# Patient Record
Sex: Male | Born: 2000 | Race: White | Hispanic: No | Marital: Single | Smoking: Never smoker
Health system: Southern US, Community
[De-identification: ages and names within clinical notes are randomized; demographics above are authoritative.]

## PROBLEM LIST (undated history)

## (undated) HISTORY — PX: BRAIN SURGERY: SHX531

## (undated) HISTORY — PX: OTHER SURGICAL HISTORY: SHX169

---
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
## 2019-01-01 NOTE — Addendum Note (Signed)
Addended by: Roxana Hires D on: 01/01/2019 10:44 AM   Modules accepted: Orders

## 2019-01-02 ENCOUNTER — Other Ambulatory Visit: Payer: Self-pay

## 2019-01-02 ENCOUNTER — Ambulatory Visit: Payer: BC Managed Care – PPO

## 2019-01-02 ENCOUNTER — Ambulatory Visit: Payer: BC Managed Care – PPO | Admitting: Occupational Therapy

## 2019-01-02 ENCOUNTER — Encounter: Payer: Self-pay | Admitting: Occupational Therapy

## 2019-01-02 DIAGNOSIS — R278 Other lack of coordination: Secondary | ICD-10-CM

## 2019-01-02 DIAGNOSIS — M6281 Muscle weakness (generalized): Secondary | ICD-10-CM
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
## 2019-01-07 ENCOUNTER — Encounter: Payer: Self-pay | Admitting: Occupational Therapy

## 2019-01-07 ENCOUNTER — Other Ambulatory Visit: Payer: Self-pay

## 2019-01-07 ENCOUNTER — Ambulatory Visit: Payer: BC Managed Care – PPO

## 2019-01-07 ENCOUNTER — Ambulatory Visit: Payer: BC Managed Care – PPO | Admitting: Occupational Therapy

## 2019-01-07 DIAGNOSIS — M6281 Muscle weakness (generalized): Secondary | ICD-10-CM | POA: Diagnosis not present

## 2019-01-07 DIAGNOSIS — R278 Other lack of coordination: Secondary | ICD-10-CM
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
## 2019-01-09 ENCOUNTER — Ambulatory Visit: Payer: BC Managed Care – PPO | Admitting: Occupational Therapy

## 2019-01-09 ENCOUNTER — Encounter: Payer: Self-pay | Admitting: Occupational Therapy

## 2019-01-09 ENCOUNTER — Other Ambulatory Visit: Payer: Self-pay

## 2019-01-09 ENCOUNTER — Ambulatory Visit: Payer: BC Managed Care – PPO

## 2019-01-09 DIAGNOSIS — M6281 Muscle weakness (generalized): Secondary | ICD-10-CM

## 2019-01-09 DIAGNOSIS — R278 Other lack of coordination: Secondary | ICD-10-CM
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
## 2019-01-14 ENCOUNTER — Ambulatory Visit: Payer: BC Managed Care – PPO | Admitting: Occupational Therapy

## 2019-01-14 ENCOUNTER — Ambulatory Visit: Payer: BC Managed Care – PPO

## 2019-01-14 ENCOUNTER — Other Ambulatory Visit: Payer: Self-pay

## 2019-01-14 DIAGNOSIS — M6281 Muscle weakness (generalized): Secondary | ICD-10-CM | POA: Diagnosis not present

## 2019-01-14 DIAGNOSIS — R278 Other lack of coordination: Secondary | ICD-10-CM
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
## 2019-01-16 ENCOUNTER — Encounter: Payer: Self-pay | Admitting: Occupational Therapy

## 2019-01-16 ENCOUNTER — Other Ambulatory Visit: Payer: Self-pay

## 2019-01-16 ENCOUNTER — Ambulatory Visit: Payer: BC Managed Care – PPO | Admitting: Occupational Therapy

## 2019-01-16 ENCOUNTER — Ambulatory Visit: Payer: BC Managed Care – PPO

## 2019-01-16 DIAGNOSIS — M6281 Muscle weakness (generalized): Secondary | ICD-10-CM

## 2019-01-16 DIAGNOSIS — R278 Other lack of coordination: Secondary | ICD-10-CM

## 2019-01-16 DIAGNOSIS — R2681 Unsteadiness on feet: Secondary | ICD-10-CM
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
## 2019-01-21 ENCOUNTER — Ambulatory Visit: Payer: BC Managed Care – PPO

## 2019-01-21 ENCOUNTER — Other Ambulatory Visit: Payer: Self-pay

## 2019-01-21 ENCOUNTER — Ambulatory Visit: Payer: BC Managed Care – PPO | Attending: Physical Medicine and Rehabilitation | Admitting: Occupational Therapy

## 2019-01-21 DIAGNOSIS — R278 Other lack of coordination: Secondary | ICD-10-CM

## 2019-01-21 DIAGNOSIS — R2681 Unsteadiness on feet: Secondary | ICD-10-CM | POA: Insufficient documentation

## 2019-01-21 DIAGNOSIS — M6281 Muscle weakness (generalized): Secondary | ICD-10-CM | POA: Insufficient documentation
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
## 2019-01-23 ENCOUNTER — Encounter: Payer: Self-pay | Admitting: Occupational Therapy

## 2019-01-23 ENCOUNTER — Other Ambulatory Visit: Payer: Self-pay

## 2019-01-23 ENCOUNTER — Ambulatory Visit: Payer: BC Managed Care – PPO | Admitting: Occupational Therapy

## 2019-01-23 ENCOUNTER — Ambulatory Visit: Payer: BC Managed Care – PPO

## 2019-01-23 DIAGNOSIS — R2681 Unsteadiness on feet: Secondary | ICD-10-CM

## 2019-01-23 DIAGNOSIS — M6281 Muscle weakness (generalized): Secondary | ICD-10-CM | POA: Diagnosis not present

## 2019-01-23 DIAGNOSIS — R278 Other lack of coordination: Secondary | ICD-10-CM
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
## 2019-01-23 NOTE — Therapy (Signed)
[place] MAIN [facility] SERVICES [address] [place], [place], [address] Phone: [phone]   Fax:  [phone]  Physical Therapy Treatment  Patient Details  Name: Edgar [name] MRN: [id number] Date of Birth: 2000-10-17 No data recorded  Encounter Date: 01/23/2019  PT End of Session - 01/23/19 1530    Visit Number  285    Number of Visits  410    Date for PT Re-Evaluation  03/13/19    Authorization Type  goals last updated on 12/26/18    Authorization Time Period  Medicaid authorization 2x/wk (total of 12 visits): 01/15/19-02/25/19    Authorization - Visit Number  3    Authorization - Number of Visits  12    PT Start Time  1520    PT Stop Time  1600    PT Time Calculation (min)  40 min    Equipment Utilized During Treatment  Gait belt    Activity Tolerance  Patient tolerated treatment well;No increased pain    Behavior During Therapy  [facility] for tasks assessed/performed       History reviewed. No pertinent past medical history.  History reviewed. No pertinent surgical history.  There were no vitals filed for this visit.  Subjective Assessment - 01/23/19 1529    Subjective  Pt reports doing well today.  He reports no pain or soreness today or after last session.  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 No questions or concerns at this time.    Pertinent History  personal factors affecting rehab: younger in age, time since initial injury, high fall risk, good caregiver support, going back to school so limited time available;     How long can you sit comfortably?  NA    How long can you walk comfortably?  2-3 laps around a small track;     Diagnostic tests  None recent;     Patient Stated Goals  be able to pick up his 60 pound boxer/pit bull mix dog, Buster; to be able to get up from the floor without pushing off of an object with his hands.     Currently in Pain?  No/denies           TREATMENT    Ther-ex Quantum single leg seated leg press 150# x 20 BLE, 165# x 9 on RLE and x 14 on LLE side; Forward broad jumps in 1, 2, and 3jump sequences x multiple bouts;  Squat side shuffle in // bars x 6 lengths; Squats with bottom taps on chair without sitting x 10, CGA only with one LOB for which pt is able to self-correct   Neuromuscular Re-education  Basketball lateral passes with therapist during gait 75' x 3; Basketball forward passes with therapist during gait x 75'; Soccer ball passes laterally x 75' each direction; Running in hallway 40' x 2, CGA to minA+1 for safety/stability; Soccer goalie passes with quick lateral shuffles, reactionary stepping, and cross-over steps x 5 minutes;   Pt educated throughout session about proper posture and technique with exercises. Improved exercise technique, movement at target joints, use of target muscles after min to mod verbal, visual, tactile cues.   Pt is less fatigued today compared to last session but is still tired during session. He spent another 5 hours driving today which was mentally fatiguing for him. He is demonstrating improving toe clearance with running in the hallway today. He is also improving with his stability during  squats and broad jumps.He will continue to benefit from PT services to address deficits in strength, balance, and mobility in order to return to full function at home and school.                          PT Long Term Goals - 12/27/18 0001      PT LONG TERM GOAL #1   Title  Patient will improve 6 min walk test to >2000 feet to allow him to fully navigate the [facility] within appropriate time frames to transition between classes as well as play basketball with his friends (2948' is distance predicted by 6MWT reference equation based on age, gender, height, and weight)       Baseline  10/05/17: 950 feet on level surface;  01/31/18: 1100 feet on level surface, 03/28/18: 1250 feet; 05/21/18: 1170'; 06/11/18: 1300'; 08/22/2018: 1200 feet R AFO, no AD; 10/01/18: 1225' no AFO, no AD; 10/31/18: 1335' AFO, no AD, cloth facemask, CGA; 12/05/18: 1370' AFO, no AD, cloth facemask, CGA; 12/26/18: 1370' AFO, no AD, cloth facemask, CGA    Time  12    Period  Weeks    Status  Partially Met    Target Date  03/13/19      PT LONG TERM GOAL #2   Title  Patient will improve FGA score by at least 6 points to indicate improved postural control for better balance specifically with head turns during ambulation and backwards walking. These skills are critical for patient to be able to safely scan his environment during ambulation as well as safely participate in recreational activities such as basketball which would require him to regularly perform dynamic head turns and backwards stepping.     Baseline  01/31/18: 18/24, 03/28/18: 19/24; 05/22/18: 20/30; 08/20/2018: 23/30; 10/02/18: 23/30; 10/31/18: 23/30; 12/05/18: 25/30; 12/26/18: 26/30     Time  12    Period  Weeks    Status  Achieved    Target Date  03/13/19      PT LONG TERM GOAL #3   Title  Pt will improve ABC by at least 13%, which will also exceed falls cut-off of 67%, in order to demonstrate clinically significant improvement in balance confidence and decrease his risk for future falls.    Baseline  10/01/18: 64.375%; 10/31/18: 84.69%    Time  12    Period  Weeks    Status  Achieved      PT LONG TERM GOAL #4   Title  Patient will demonstrate floor to/from stand transfer without UE support and no external assistance in the possibly that he needs to perform tasks at floor level or if he needs to stand from the ground after a fall.    Baseline  08/20/2018: Requires L UE support on chair when rising from kneeling; 08/29/2018: required min A hand held assist when rising from tall kneeling; 10/01/18: Unable to transfer to/from floor without minA+1 assist from therapist as well as LUE support on a surface  such as a chair or railing. 11/06/18: Able to perform with rear knee (right) on Airex pad and BUE pressing through L knee, unable to perform from floor. 12/10/18: Able to perform with rear LE (left) on towel inconsistently requiring BUE through RLE to come to stand; 12/26/18: able to perform with L knee on towel inconsistently requiring BUE through RLE to come to stand, unable to perform with R knee on towel without BUE support on //  bars and mod/maxA from PT.     Time  12    Period  Weeks    Status  Partially Met    Target Date  03/13/19      PT LONG TERM GOAL #5   Title  Pt will increase LEFS by at least 9 points in order to demonstrate significant improvement in lower extremity function especially related to running and cutting which will allow him to participate in basketball with his friends.    Baseline  10/01/18: 51/80; 10/31/18: 59/80; 12/10/18: 62/80    Time  12    Period  Weeks    Status  Achieved      Additional Long Term Goals   Additional Long Term Goals  Yes      PT LONG TERM GOAL #6   Title  Pt will improve Mini BESTest by at least 4 points in order to demonstrate clinically significant improvement in his balance especially in the domains of vertical head turns, backwards walking, and eyes closed walking to allow him to safely participate in sports, scan his environment when walking across campus, and decrease his risk for falls in low light environments.     Baseline  10/01/18: 21/28; 10/31/18: 24/28; 12/05/18: 24/28; 12/26/18: 25/28    Time  12    Period  Weeks    Status  Achieved    Target Date  03/13/19            Plan - 01/23/19 1531    Clinical Impression Statement  Pt is less fatigued today compared to last session but is still tired during session. He spent another 5 hours driving today which was mentally fatiguing for him. He is demonstrating improving toe clearance with running in the hallway today. He is also improving with his stability during squats and broad jumps. He  will continue to benefit from PT services to address deficits in strength, balance, and mobility in order to return to full function at home and school.    Examination-Activity Limitations  Carry;Lift;Squat;Stairs;Caring for Others;Reach Overhead;Bed Mobility;Bend;Transfers;Other   age-appropriate functional activities such as bed mobility, transfers, bending, lifting, carrying, lunging, running, squatting, walking, stairs, athletics, navigation across uneven ground,    Examination-Participation Restrictions  School;Community Activity;Driving;Other   athletics, community and social participation, and pet care   Stability/Clinical Decision Making  Evolving/Moderate complexity    Rehab Potential  Good    Clinical Impairments Affecting Rehab Potential  positive: good caregiver support, young in age, no co-morbidities; Negative: Chronicity, high fall risk; Patient's clinical presentation is stable as he has had no recent falls and has been responding well to conservative treatment;     PT Frequency  2x / week    PT Duration  12 weeks    PT Treatment/Interventions  Cryotherapy;Electrical Stimulation;Moist Heat;Gait training;Neuromuscular re-education;Balance training;Therapeutic exercise;Therapeutic activities;Functional mobility training;Stair training;Patient/family education;Orthotic Fit/Training;Energy conservation;Dry needling;Passive range of motion;Aquatic Therapy;ADLs/Self Care Home Management;DME Instruction;Manual techniques;Vestibular   cryotherapy/moist heat for pt comfort for soreness, relaxation, & body temperature regulation PRN; estim for improved muscle activation, relaxation of tight muscles, or soreness relief PRN; dry needling for improved soreness, decreased muscle tension PRN   PT Next Visit Plan  Come to stand from 1/2 kneeling, Ambulation with ball tossing, pivot turns, stepping over obstacles, jumping, bounding, lunging, floor to/from stand transfers, continue practicing with new AFO     PT Home Exercise Plan  New HEP provided on 12/19/18: [email or web address]    Consulted and Agree with Plan of Care  Patient  Patient will benefit from skilled therapeutic intervention in order to improve the following deficits and impairments:  Abnormal gait, Decreased cognition, Decreased mobility, Decreased coordination, Decreased activity tolerance, Decreased endurance, Decreased strength, Difficulty walking, Decreased safety awareness, Decreased balance, Impaired perceived functional ability, Impaired UE functional use, Decreased range of motion  Visit Diagnosis: Muscle weakness (generalized)  Unsteadiness on feet  Other lack of coordination     Problem List There are no active problems to display for this patient.  Phillips Grout PT, DPT, GCS  , 01/23/2019, 4:03 PM  [place] MAIN [facility] SERVICES [address] [place], [place], [address] Phone: [phone]   Fax:  [phone]  Name: YUVAAN OLANDER MRN: [id number] Date of Birth: November 30, 2000

## 2019-01-28 ENCOUNTER — Ambulatory Visit: Payer: BC Managed Care – PPO

## 2019-01-28 ENCOUNTER — Encounter: Payer: Self-pay | Admitting: Occupational Therapy

## 2019-01-28 ENCOUNTER — Other Ambulatory Visit: Payer: Self-pay

## 2019-01-28 ENCOUNTER — Ambulatory Visit: Payer: BC Managed Care – PPO | Admitting: Occupational Therapy

## 2019-01-28 DIAGNOSIS — M6281 Muscle weakness (generalized): Secondary | ICD-10-CM

## 2019-01-28 DIAGNOSIS — R2681 Unsteadiness on feet: Secondary | ICD-10-CM

## 2019-01-28 DIAGNOSIS — R278 Other lack of coordination: Secondary | ICD-10-CM

## 2019-01-28 NOTE — Therapy (Signed)
[place] MAIN [facility] SERVICES [address] [place], [place], [address] Phone: [phone]   Fax:  [phone]  Physical Therapy Treatment  Patient Details  Name: Edgar [name] MRN: [id number] Date of Birth: 2000/11/30 No data recorded  Encounter Date: 01/28/2019  PT End of Session - 01/28/19 1647    Visit Number  286    Number of Visits  410    Date for PT Re-Evaluation  03/13/19    Authorization Type  goals last updated on 12/26/18    Authorization Time Period  Medicaid authorization 2x/wk (total of 12 visits): 01/15/19-02/25/19    Authorization - Visit Number  4    Authorization - Number of Visits  12    PT Start Time  5188    PT Stop Time  1730    PT Time Calculation (min)  36 min    Equipment Utilized During Treatment  Gait belt    Activity Tolerance  Patient tolerated treatment well;No increased pain    Behavior During Therapy  [facility] for tasks assessed/performed       History reviewed. No pertinent past medical history.  History reviewed. No pertinent surgical history.  There were no vitals filed for this visit.  Subjective Assessment - 01/28/19 1655    Subjective  Pt reports doing well today.  He reports no pain or soreness today.  He reports that he drove here today. No questions or concerns at this time.    Pertinent History  personal factors affecting rehab: younger in age, time since initial injury, high fall risk, good caregiver support, going back to school so limited time available;     How long can you sit comfortably?  NA    How long can you walk comfortably?  2-3 laps around a small track;     Diagnostic tests  None recent;     Patient Stated Goals  be able to pick up his 60 pound boxer/pit bull mix dog, Buster; to be able to get up from the floor without pushing off of an object with his hands.     Currently in Pain?  No/denies         TREATMENT    Ther-ex Quantumsingle legseated leg press 150# x 20  BLE, 165# x 8 on RLE and x14 on LLE side;   Neuromuscular Re-education Basketball forward passes with therapist during forward and retro gait x 75'; Soccer ball passes with forward and retro gait x 75' each direction; Agility ladder with 2 bouts each of 2 feet in each box forward, 2 feet in each box laterally, and "2 in 1" out forward. Forward broad jumps in 1, 2, and 3jump sequences x multiple bouts;   Pt educated throughout session about proper posture and technique with exercises. Improved exercise technique, movement at target joints, use of target muscles after min to mod verbal, visual, tactile cues.   Ptdemonstrated good motivation throughout today's session.  His session was shortened today due to him needing to go to the bathroom between his OT and PT sessions, resulting in him being late to PT.  He is demonstrating good improvements with foot clearance and foot speed during agility ladder exercises today. improving toe clearance with running in the hallway today. He is also improving with his stability during broad jumps, requiring steadying intermittently for LOB anteriorly.He will continue to benefit from PT services to address deficits in strength, balance, and mobility in order to return to full function at home and  school.           PT Long Term Goals - 12/27/18 0001      PT LONG TERM GOAL #1   Title  Patient will improve 6 min walk test to >2000 feet to allow him to fully navigate the [facility] within appropriate time frames to transition between classes as well as play basketball with his friends (2948' is distance predicted by 6MWT reference equation based on age, gender, height, and weight)       Baseline  10/05/17: 950 feet on level surface; 01/31/18: 1100 feet on level surface, 03/28/18: 1250 feet; 05/21/18: 1170'; 06/11/18: 1300'; 08/22/2018: 1200 feet R AFO, no AD; 10/01/18: 1225' no AFO, no AD; 10/31/18: 1335' AFO, no AD, cloth facemask,  CGA; 12/05/18: 1370' AFO, no AD, cloth facemask, CGA; 12/26/18: 1370' AFO, no AD, cloth facemask, CGA    Time  12    Period  Weeks    Status  Partially Met    Target Date  03/13/19      PT LONG TERM GOAL #2   Title  Patient will improve FGA score by at least 6 points to indicate improved postural control for better balance specifically with head turns during ambulation and backwards walking. These skills are critical for patient to be able to safely scan his environment during ambulation as well as safely participate in recreational activities such as basketball which would require him to regularly perform dynamic head turns and backwards stepping.     Baseline  01/31/18: 18/24, 03/28/18: 19/24; 05/22/18: 20/30; 08/20/2018: 23/30; 10/02/18: 23/30; 10/31/18: 23/30; 12/05/18: 25/30; 12/26/18: 26/30     Time  12    Period  Weeks    Status  Achieved    Target Date  03/13/19      PT LONG TERM GOAL #3   Title  Pt will improve ABC by at least 13%, which will also exceed falls cut-off of 67%, in order to demonstrate clinically significant improvement in balance confidence and decrease his risk for future falls.    Baseline  10/01/18: 64.375%; 10/31/18: 84.69%    Time  12    Period  Weeks    Status  Achieved      PT LONG TERM GOAL #4   Title  Patient will demonstrate floor to/from stand transfer without UE support and no external assistance in the possibly that he needs to perform tasks at floor level or if he needs to stand from the ground after a fall.    Baseline  08/20/2018: Requires L UE support on chair when rising from kneeling; 08/29/2018: required min A hand held assist when rising from tall kneeling; 10/01/18: Unable to transfer to/from floor without minA+1 assist from therapist as well as LUE support on a surface such as a chair or railing. 11/06/18: Able to perform with rear knee (right) on Airex pad and BUE pressing through L knee, unable to perform from floor. 12/10/18: Able to perform with rear LE (left) on  towel inconsistently requiring BUE through RLE to come to stand; 12/26/18: able to perform with L knee on towel inconsistently requiring BUE through RLE to come to stand, unable to perform with R knee on towel without BUE support on // bars and mod/maxA from PT.     Time  12    Period  Weeks    Status  Partially Met    Target Date  03/13/19      PT LONG TERM GOAL #5  Title  Pt will increase LEFS by at least 9 points in order to demonstrate significant improvement in lower extremity function especially related to running and cutting which will allow him to participate in basketball with his friends.    Baseline  10/01/18: 51/80; 10/31/18: 59/80; 12/10/18: 62/80    Time  12    Period  Weeks    Status  Achieved      Additional Long Term Goals   Additional Long Term Goals  Yes      PT LONG TERM GOAL #6   Title  Pt will improve Mini BESTest by at least 4 points in order to demonstrate clinically significant improvement in his balance especially in the domains of vertical head turns, backwards walking, and eyes closed walking to allow him to safely participate in sports, scan his environment when walking across campus, and decrease his risk for falls in low light environments.     Baseline  10/01/18: 21/28; 10/31/18: 24/28; 12/05/18: 24/28; 12/26/18: 25/28    Time  12    Period  Weeks    Status  Achieved    Target Date  03/13/19            Plan - 01/28/19 1851    Clinical Impression Statement  Pt demonstrated good motivation throughout today's session.  His session was shortened today due to him needing to go to the bathroom between his OT and PT sessions, resulting in him being late to PT.  He is demonstrating good improvements with foot clearance and foot speed during agility ladder exercises today. improving toe clearance with running in the hallway today. He is also improving with his stability during broad jumps, requiring steadying intermittently for LOB anteriorly. He will continue to benefit  from PT services to address deficits in strength, balance, and mobility in order to return to full function at home and school.    Examination-Activity Limitations  Carry;Lift;Squat;Stairs;Caring for Others;Reach Overhead;Bed Mobility;Bend;Transfers;Other   age-appropriate functional activities such as bed mobility, transfers, bending, lifting, carrying, lunging, running, squatting, walking, stairs, athletics, navigation across uneven ground,    Examination-Participation Restrictions  School;Community Activity;Driving;Other   athletics, community and social participation, and pet care   Stability/Clinical Decision Making  Evolving/Moderate complexity    Rehab Potential  Good    Clinical Impairments Affecting Rehab Potential  positive: good caregiver support, young in age, no co-morbidities; Negative: Chronicity, high fall risk; Patient's clinical presentation is stable as he has had no recent falls and has been responding well to conservative treatment;     PT Frequency  2x / week    PT Duration  12 weeks    PT Treatment/Interventions  Cryotherapy;Electrical Stimulation;Moist Heat;Gait training;Neuromuscular re-education;Balance training;Therapeutic exercise;Therapeutic activities;Functional mobility training;Stair training;Patient/family education;Orthotic Fit/Training;Energy conservation;Dry needling;Passive range of motion;Aquatic Therapy;ADLs/Self Care Home Management;DME Instruction;Manual techniques;Vestibular   cryotherapy/moist heat for pt comfort for soreness, relaxation, & body temperature regulation PRN; estim for improved muscle activation, relaxation of tight muscles, or soreness relief PRN; dry needling for improved soreness, decreased muscle tension PRN   PT Next Visit Plan  Come to stand from 1/2 kneeling, Ambulation with ball tossing, pivot turns, stepping over obstacles, jumping, bounding, lunging, floor to/from stand transfers, continue practicing with new AFO    PT Home Exercise Plan   New HEP provided on 12/19/18: [email or web address]    Consulted and Agree with Plan of Care  Patient       Patient will benefit from skilled therapeutic intervention in order to improve the following deficits and  impairments:  Abnormal gait, Decreased cognition, Decreased mobility, Decreased coordination, Decreased activity tolerance, Decreased endurance, Decreased strength, Difficulty walking, Decreased safety awareness, Decreased balance, Impaired perceived functional ability, Impaired UE functional use, Decreased range of motion  Visit Diagnosis: Muscle weakness (generalized)  Unsteadiness on feet  Other lack of coordination     Problem List There are no active problems to display for this patient.   This entire session was performed under direct supervision and direction of a licensed therapist/therapist assistant . I have personally read, edited and approve of the note as written.   Lutricia Horsfall, SPT Phillips Grout PT, DPT, GCS  Huprich,Jason 01/29/2019, 3:35 PM  [place] MAIN [facility] SERVICES [address] [place], [place], [address] Phone: [phone]   Fax:  [phone]  Name: PRUDENCIO VELAZCO MRN: [id number] Date of Birth: 04-Jul-2000
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
## 2019-01-30 ENCOUNTER — Other Ambulatory Visit: Payer: Self-pay

## 2019-01-30 ENCOUNTER — Ambulatory Visit: Payer: BC Managed Care – PPO

## 2019-01-30 ENCOUNTER — Ambulatory Visit: Payer: BC Managed Care – PPO | Admitting: Occupational Therapy

## 2019-01-30 ENCOUNTER — Encounter: Payer: Self-pay | Admitting: Occupational Therapy

## 2019-01-30 DIAGNOSIS — R2681 Unsteadiness on feet: Secondary | ICD-10-CM

## 2019-01-30 DIAGNOSIS — M6281 Muscle weakness (generalized): Secondary | ICD-10-CM

## 2019-01-30 DIAGNOSIS — R278 Other lack of coordination: Secondary | ICD-10-CM
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
## 2019-01-30 NOTE — Therapy (Signed)
[place] MAIN [facility] SERVICES [address] [place], [place], [address] Phone: [phone]   Fax:  [phone]  Physical Therapy Treatment  Patient Details  Name: Edgar [name] MRN: [id number] Date of Birth: 2000-10-13 No data recorded  Encounter Date: 01/30/2019  PT End of Session - 01/30/19 1519    Visit Number  [id number]    Number of Visits  410    Date for PT Re-Evaluation  03/13/19    Authorization Type  goals last updated on 12/26/18    Authorization Time Period  Medicaid authorization 2x/wk (total of 12 visits): 01/15/19-02/25/19    Authorization - Visit Number  5    Authorization - Number of Visits  12    PT Start Time  4825    PT Stop Time  1600    PT Time Calculation (min)  45 min    Equipment Utilized During Treatment  Gait belt    Activity Tolerance  Patient tolerated treatment well;No increased pain    Behavior During Therapy  [facility] for tasks assessed/performed       History reviewed. No pertinent past medical history.  History reviewed. No pertinent surgical history.  There were no vitals filed for this visit.  Subjective Assessment - 01/30/19 1517    Subjective  Pt reports doing well today.  He reports that he had clonus in his LEs this morning, but that it went away after stretching out his legs.  He reports no pain or soreness today, but did have some soreness after last session. No questions or concerns at this time.    Pertinent History  personal factors affecting rehab: younger in age, time since initial injury, high fall risk, good caregiver support, going back to school so limited time available;     How long can you sit comfortably?  NA    How long can you walk comfortably?  2-3 laps around a small track;     Diagnostic tests  None recent;     Patient Stated Goals  be able to pick up his 60 pound boxer/pit bull mix dog, Buster; to be able to get up from the floor without pushing off of an object with his hands.      Currently in Pain?  No/denies        TREATMENT    Ther-ex Octane X-Ride 5 min L4 for 5 min for warmup during history Lunges in // barswith airex pad underrearknee,CGA provided throughout with intermittent modAwhen LLE is forward and modA with RLE is forward x 5 on each side with intermittent UE support in // bars; limited due to fatigue today (performed last today)   Neuromuscular Re-education Jogging on grass with CGA and occasional minA from PT throughout  164f x4 Basketball forward passes outside on sloping sidewalk 731f2 Soccer ball passes with forward gait in the grass 7519f Forward broad jumps in the grass ~5 jumps;  Ascending and Descending 5 steps in a reciprocal pattern without use of the handrails; CGA throughout.   Pt educated throughout session about proper posture and technique with exercises. Improved exercise technique, movement at target joints, use of target muscles after min to mod verbal, visual, tactile cues.   Ptdemonstrated good motivation throughout today's session.  Today, pt was challenged by performing tasks outside on a sloping sidewalk, walking through pine straw on hill and performing exercises on the grass.  He requires CGA with occasional minA during jogging on grass, with unsteadiness noted throughout.  He also  demonstrates the ability to perform 5 steps without the use of a handrail in a reciprocal pattern with CGA. He will continue to benefit from PT services to address deficits in strength, balance, and mobility in order to return to full function at home and school.       PT Long Term Goals - 12/27/18 0001      PT LONG TERM GOAL #1   Title  Patient will improve 6 min walk test to >2000 feet to allow him to fully navigate the [facility] within appropriate time frames to transition between classes as well as play basketball with his friends (2948' is distance predicted by 6MWT reference equation  based on age, gender, height, and weight)       Baseline  10/05/17: 950 feet on level surface; 01/31/18: 1100 feet on level surface, 03/28/18: 1250 feet; 05/21/18: 1170'; 06/11/18: 1300'; 08/22/2018: 1200 feet R AFO, no AD; 10/01/18: 1225' no AFO, no AD; 10/31/18: 1335' AFO, no AD, cloth facemask, CGA; 12/05/18: 1370' AFO, no AD, cloth facemask, CGA; 12/26/18: 1370' AFO, no AD, cloth facemask, CGA    Time  12    Period  Weeks    Status  Partially Met    Target Date  03/13/19      PT LONG TERM GOAL #2   Title  Patient will improve FGA score by at least 6 points to indicate improved postural control for better balance specifically with head turns during ambulation and backwards walking. These skills are critical for patient to be able to safely scan his environment during ambulation as well as safely participate in recreational activities such as basketball which would require him to regularly perform dynamic head turns and backwards stepping.     Baseline  01/31/18: 18/24, 03/28/18: 19/24; 05/22/18: 20/30; 08/20/2018: 23/30; 10/02/18: 23/30; 10/31/18: 23/30; 12/05/18: 25/30; 12/26/18: 26/30     Time  12    Period  Weeks    Status  Achieved    Target Date  03/13/19      PT LONG TERM GOAL #3   Title  Pt will improve ABC by at least 13%, which will also exceed falls cut-off of 67%, in order to demonstrate clinically significant improvement in balance confidence and decrease his risk for future falls.    Baseline  10/01/18: 64.375%; 10/31/18: 84.69%    Time  12    Period  Weeks    Status  Achieved      PT LONG TERM GOAL #4   Title  Patient will demonstrate floor to/from stand transfer without UE support and no external assistance in the possibly that he needs to perform tasks at floor level or if he needs to stand from the ground after a fall.    Baseline  08/20/2018: Requires L UE support on chair when rising from kneeling; 08/29/2018: required min A hand held assist when rising from tall kneeling; 10/01/18: Unable to  transfer to/from floor without minA+1 assist from therapist as well as LUE support on a surface such as a chair or railing. 11/06/18: Able to perform with rear knee (right) on Airex pad and BUE pressing through L knee, unable to perform from floor. 12/10/18: Able to perform with rear LE (left) on towel inconsistently requiring BUE through RLE to come to stand; 12/26/18: able to perform with L knee on towel inconsistently requiring BUE through RLE to come to stand, unable to perform with R knee on towel without BUE support on // bars and mod/maxA  from PT.     Time  12    Period  Weeks    Status  Partially Met    Target Date  03/13/19      PT LONG TERM GOAL #5   Title  Pt will increase LEFS by at least 9 points in order to demonstrate significant improvement in lower extremity function especially related to running and cutting which will allow him to participate in basketball with his friends.    Baseline  10/01/18: 51/80; 10/31/18: 59/80; 12/10/18: 62/80    Time  12    Period  Weeks    Status  Achieved      Additional Long Term Goals   Additional Long Term Goals  Yes      PT LONG TERM GOAL #6   Title  Pt will improve Mini BESTest by at least 4 points in order to demonstrate clinically significant improvement in his balance especially in the domains of vertical head turns, backwards walking, and eyes closed walking to allow him to safely participate in sports, scan his environment when walking across campus, and decrease his risk for falls in low light environments.     Baseline  10/01/18: 21/28; 10/31/18: 24/28; 12/05/18: 24/28; 12/26/18: 25/28    Time  12    Period  Weeks    Status  Achieved    Target Date  03/13/19            Plan - 01/30/19 1620    Clinical Impression Statement  Pt demonstrated good motivation throughout today's session.  Today, pt was challenged by performing tasks outside on a sloping sidewalk, walking through pine straw on hill and performing exercises on the grass.  He  requires CGA with occasional minA during jogging on grass, with unsteadiness noted throughout.  He also demonstrates the ability to perform 5 steps without the use of a handrail in a reciprocal pattern with CGA.  He will continue to benefit from PT services to address deficits in strength, balance, and mobility in order to return to full function at home and school.    Examination-Activity Limitations  Carry;Lift;Squat;Stairs;Caring for Others;Reach Overhead;Bed Mobility;Bend;Transfers;Other   age-appropriate functional activities such as bed mobility, transfers, bending, lifting, carrying, lunging, running, squatting, walking, stairs, athletics, navigation across uneven ground,    Examination-Participation Restrictions  School;Community Activity;Driving;Other   athletics, community and social participation, and pet care   Stability/Clinical Decision Making  Evolving/Moderate complexity    Rehab Potential  Good    Clinical Impairments Affecting Rehab Potential  positive: good caregiver support, young in age, no co-morbidities; Negative: Chronicity, high fall risk; Patient's clinical presentation is stable as he has had no recent falls and has been responding well to conservative treatment;     PT Frequency  2x / week    PT Duration  12 weeks    PT Treatment/Interventions  Cryotherapy;Electrical Stimulation;Moist Heat;Gait training;Neuromuscular re-education;Balance training;Therapeutic exercise;Therapeutic activities;Functional mobility training;Stair training;Patient/family education;Orthotic Fit/Training;Energy conservation;Dry needling;Passive range of motion;Aquatic Therapy;ADLs/Self Care Home Management;DME Instruction;Manual techniques;Vestibular   cryotherapy/moist heat for pt comfort for soreness, relaxation, & body temperature regulation PRN; estim for improved muscle activation, relaxation of tight muscles, or soreness relief PRN; dry needling for improved soreness, decreased muscle tension PRN    PT Next Visit Plan  Come to stand from 1/2 kneeling, Ambulation with ball tossing, pivot turns, stepping over obstacles, jumping, bounding, lunging, floor to/from stand transfers, continue practicing with new AFO    PT Home Exercise Plan  New HEP provided on 12/19/18:  [id number]    Consulted and Agree with Plan of Care  Patient       Patient will benefit from skilled therapeutic intervention in order to improve the following deficits and impairments:  Abnormal gait, Decreased cognition, Decreased mobility, Decreased coordination, Decreased activity tolerance, Decreased endurance, Decreased strength, Difficulty walking, Decreased safety awareness, Decreased balance, Impaired perceived functional ability, Impaired UE functional use, Decreased range of motion  Visit Diagnosis: Muscle weakness (generalized)  Unsteadiness on feet  Other lack of coordination     Problem List There are no active problems to display for this patient.  This entire session was performed under direct supervision and direction of a licensed therapist/therapist assistant . I have personally read, edited and approve of the note as written.   Lutricia Horsfall, SPT Phillips Grout PT, DPT, GCS  Huprich,Jason 01/31/2019, 9:47 AM  [place] MAIN [facility] SERVICES [address] [place], [place], [address] Phone: [phone]   Fax:  [phone]  Name: DEQUAVIOUS HARSHBERGER MRN: [id number] Date of Birth: 2000-12-02

## 2019-02-04 ENCOUNTER — Ambulatory Visit: Payer: BC Managed Care – PPO | Admitting: Occupational Therapy

## 2019-02-04 ENCOUNTER — Encounter: Payer: Self-pay | Admitting: Occupational Therapy

## 2019-02-04 ENCOUNTER — Ambulatory Visit: Payer: BC Managed Care – PPO

## 2019-02-04 ENCOUNTER — Other Ambulatory Visit: Payer: Self-pay

## 2019-02-04 DIAGNOSIS — R2681 Unsteadiness on feet: Secondary | ICD-10-CM

## 2019-02-04 DIAGNOSIS — M6281 Muscle weakness (generalized): Secondary | ICD-10-CM | POA: Diagnosis not present

## 2019-02-04 DIAGNOSIS — R278 Other lack of coordination: Secondary | ICD-10-CM

## 2019-02-04 NOTE — Therapy (Signed)
[place] MAIN [facility] SERVICES [address] [place], [place], [address] Phone: [phone]   Fax:  [phone]  Physical Therapy Treatment  Patient Details  Name: Edgar [name] MRN: [id number] Date of Birth: 2001/04/11 No data recorded  Encounter Date: 02/04/2019  PT End of Session - 02/04/19 1649    Visit Number  288    Number of Visits  410    Date for PT Re-Evaluation  03/13/19    Authorization Type  goals last updated on 12/26/18    Authorization Time Period  Medicaid authorization 2x/wk (total of 12 visits): 01/15/19-02/25/19    Authorization - Visit Number  6    Authorization - Number of Visits  12    PT Start Time  9381    PT Stop Time  1730    PT Time Calculation (min)  40 min    Equipment Utilized During Treatment  Gait belt    Activity Tolerance  Patient tolerated treatment well;No increased pain    Behavior During Therapy  [facility] for tasks assessed/performed       History reviewed. No pertinent past medical history.  History reviewed. No pertinent surgical history.  There were no vitals filed for this visit.  Subjective Assessment - 02/04/19 1653    Subjective  Pt reports doing well today. He reports no pain or soreness today, but states that his L quad was very sore after last session.  No new questions or concerns at this time.    Pertinent History  personal factors affecting rehab: younger in age, time since initial injury, high fall risk, good caregiver support, going back to school so limited time available;     How long can you sit comfortably?  NA    How long can you walk comfortably?  2-3 laps around a small track;     Diagnostic tests  None recent;     Patient Stated Goals  be able to pick up his 60 pound boxer/pit bull mix dog, Buster; to be able to get up from the floor without pushing off of an object with his hands.     Currently in Pain?  No/denies          TREATMENT    Ther-ex Quantumsingle  legseated leg press 150# x 20BLE, 165# 2x15 on RLE and 1x20, 1x15 on LLEside; Lungesat edge of therapy mat with a chair with arm rests on the opposite side.  Performed with towel under RLE x5; performed with LLE forward on airex pad with several LOBs, but completed x5.  CGA provided throughout with intermittent maxAwhen LLE is forward  Neuromuscular Re-education Basketball lateral passes with therapist during gait 75' x 3; Basketball forward passes with therapist during gait x 75'; Soccer ball passes laterally x 75' each direction; Agility ladder: 2 feet forward, 2 feet laterally, 2 in 1 out Forward broad jumps in 1, 2, and 3jump sequences x multiple bouts;     Pt educated throughout session about proper posture and technique with exercises. Improved exercise technique, movement at target joints, use of target muscles after min to mod verbal, visual, tactile cues.   Ptdemonstrated good motivation throughout today's session.  He demonstrates improved coordination with agility ladder exercises, gaining more foot clearance and faster foot speed, but requires up to minA for intermittent steadying.  He demonstrates significant difficulty with coming to stand from a 1/2 kneeling position with his LLE forward, experiencing several LOBs that required up to [facility] for recovery.He will continue  to benefit from PT services to address deficits in strength, balance, and mobility in order to return to full function at home and school.           PT Long Term Goals - 12/27/18 0001      PT LONG TERM GOAL #1   Title  Patient will improve 6 min walk test to >2000 feet to allow him to fully navigate the [facility] within appropriate time frames to transition between classes as well as play basketball with his friends (2948' is distance predicted by 6MWT reference equation based on age, gender, height, and weight)       Baseline  10/05/17: 950 feet on level surface;  01/31/18: 1100 feet on level surface, 03/28/18: 1250 feet; 05/21/18: 1170'; 06/11/18: 1300'; 08/22/2018: 1200 feet R AFO, no AD; 10/01/18: 1225' no AFO, no AD; 10/31/18: 1335' AFO, no AD, cloth facemask, CGA; 12/05/18: 1370' AFO, no AD, cloth facemask, CGA; 12/26/18: 1370' AFO, no AD, cloth facemask, CGA    Time  12    Period  Weeks    Status  Partially Met    Target Date  03/13/19      PT LONG TERM GOAL #2   Title  Patient will improve FGA score by at least 6 points to indicate improved postural control for better balance specifically with head turns during ambulation and backwards walking. These skills are critical for patient to be able to safely scan his environment during ambulation as well as safely participate in recreational activities such as basketball which would require him to regularly perform dynamic head turns and backwards stepping.     Baseline  01/31/18: 18/24, 03/28/18: 19/24; 05/22/18: 20/30; 08/20/2018: 23/30; 10/02/18: 23/30; 10/31/18: 23/30; 12/05/18: 25/30; 12/26/18: 26/30     Time  12    Period  Weeks    Status  Achieved    Target Date  03/13/19      PT LONG TERM GOAL #3   Title  Pt will improve ABC by at least 13%, which will also exceed falls cut-off of 67%, in order to demonstrate clinically significant improvement in balance confidence and decrease his risk for future falls.    Baseline  10/01/18: 64.375%; 10/31/18: 84.69%    Time  12    Period  Weeks    Status  Achieved      PT LONG TERM GOAL #4   Title  Patient will demonstrate floor to/from stand transfer without UE support and no external assistance in the possibly that he needs to perform tasks at floor level or if he needs to stand from the ground after a fall.    Baseline  08/20/2018: Requires L UE support on chair when rising from kneeling; 08/29/2018: required min A hand held assist when rising from tall kneeling; 10/01/18: Unable to transfer to/from floor without minA+1 assist from therapist as well as LUE support on a surface  such as a chair or railing. 11/06/18: Able to perform with rear knee (right) on Airex pad and BUE pressing through L knee, unable to perform from floor. 12/10/18: Able to perform with rear LE (left) on towel inconsistently requiring BUE through RLE to come to stand; 12/26/18: able to perform with L knee on towel inconsistently requiring BUE through RLE to come to stand, unable to perform with R knee on towel without BUE support on // bars and mod/maxA from PT.     Time  12    Period  Weeks  Status  Partially Met    Target Date  03/13/19      PT LONG TERM GOAL #5   Title  Pt will increase LEFS by at least 9 points in order to demonstrate significant improvement in lower extremity function especially related to running and cutting which will allow him to participate in basketball with his friends.    Baseline  10/01/18: 51/80; 10/31/18: 59/80; 12/10/18: 62/80    Time  12    Period  Weeks    Status  Achieved      Additional Long Term Goals   Additional Long Term Goals  Yes      PT LONG TERM GOAL #6   Title  Pt will improve Mini BESTest by at least 4 points in order to demonstrate clinically significant improvement in his balance especially in the domains of vertical head turns, backwards walking, and eyes closed walking to allow him to safely participate in sports, scan his environment when walking across campus, and decrease his risk for falls in low light environments.     Baseline  10/01/18: 21/28; 10/31/18: 24/28; 12/05/18: 24/28; 12/26/18: 25/28    Time  12    Period  Weeks    Status  Achieved    Target Date  03/13/19            Plan - 02/05/19 1106    Clinical Impression Statement  Pt demonstrated good motivation throughout today's session.  He demonstrates improved coordination with agility ladder exercises, gaining more foot clearance and faster foot speed, but requires up to minA for intermittent steadying.  He demonstrates significant difficulty with coming to stand from a 1/2 kneeling  position with his LLE forward, experiencing several LOBs that required up to [facility] for recovery.   He will continue to benefit from PT services to address deficits in strength, balance, and mobility in order to return to full function at home and school.    Examination-Activity Limitations  Carry;Lift;Squat;Stairs;Caring for Others;Reach Overhead;Bed Mobility;Bend;Transfers;Other   age-appropriate functional activities such as bed mobility, transfers, bending, lifting, carrying, lunging, running, squatting, walking, stairs, athletics, navigation across uneven ground,    Examination-Participation Restrictions  School;Community Activity;Driving;Other   athletics, community and social participation, and pet care   Stability/Clinical Decision Making  Evolving/Moderate complexity    Rehab Potential  Good    Clinical Impairments Affecting Rehab Potential  positive: good caregiver support, young in age, no co-morbidities; Negative: Chronicity, high fall risk; Patient's clinical presentation is stable as he has had no recent falls and has been responding well to conservative treatment;     PT Frequency  2x / week    PT Duration  12 weeks    PT Treatment/Interventions  Cryotherapy;Electrical Stimulation;Moist Heat;Gait training;Neuromuscular re-education;Balance training;Therapeutic exercise;Therapeutic activities;Functional mobility training;Stair training;Patient/family education;Orthotic Fit/Training;Energy conservation;Dry needling;Passive range of motion;Aquatic Therapy;ADLs/Self Care Home Management;DME Instruction;Manual techniques;Vestibular   cryotherapy/moist heat for pt comfort for soreness, relaxation, & body temperature regulation PRN; estim for improved muscle activation, relaxation of tight muscles, or soreness relief PRN; dry needling for improved soreness, decreased muscle tension PRN   PT Next Visit Plan  Come to stand from 1/2 kneeling, Ambulation with ball tossing, pivot turns, stepping over  obstacles, jumping, bounding, lunging, floor to/from stand transfers, continue practicing with new AFO    PT Home Exercise Plan  New HEP provided on 12/19/18: [id number]    Consulted and Agree with Plan of Care  Patient       Patient will benefit from skilled therapeutic intervention  in order to improve the following deficits and impairments:  Abnormal gait, Decreased cognition, Decreased mobility, Decreased coordination, Decreased activity tolerance, Decreased endurance, Decreased strength, Difficulty walking, Decreased safety awareness, Decreased balance, Impaired perceived functional ability, Impaired UE functional use, Decreased range of motion  Visit Diagnosis: Muscle weakness (generalized)  Other lack of coordination  Unsteadiness on feet     Problem List There are no active problems to display for this patient.  This entire session was performed under direct supervision and direction of a licensed therapist/therapist assistant . I have personally read, edited and approve of the note as written.   Phillips Grout PT, DPT, GCS  Lutricia Horsfall 02/05/2019, 11:09 AM  [place] MAIN [facility] SERVICES [address] [place], [place], [address] Phone: [phone]   Fax:  [phone]  Name: Edgar [name] MRN: [id number] Date of Birth: 2000/10/02
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
## 2019-02-06 ENCOUNTER — Ambulatory Visit: Payer: BC Managed Care – PPO

## 2019-02-06 ENCOUNTER — Other Ambulatory Visit: Payer: Self-pay

## 2019-02-06 ENCOUNTER — Encounter: Payer: Self-pay | Admitting: Occupational Therapy

## 2019-02-06 ENCOUNTER — Ambulatory Visit: Payer: BC Managed Care – PPO | Admitting: Occupational Therapy

## 2019-02-06 DIAGNOSIS — R2681 Unsteadiness on feet: Secondary | ICD-10-CM

## 2019-02-06 DIAGNOSIS — R278 Other lack of coordination: Secondary | ICD-10-CM

## 2019-02-06 DIAGNOSIS — M6281 Muscle weakness (generalized): Secondary | ICD-10-CM
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
## 2019-02-06 NOTE — Therapy (Signed)
[place] MAIN [facility] SERVICES [address] [place], [place], [address] Phone: [phone]   Fax:  [phone]  Physical Therapy Treatment  Patient Details  Name: Edgar [name] MRN: [id number] Date of Birth: [birth year] No data recorded  Encounter Date: 02/06/2019  PT End of Session - 02/06/19 1526    Visit Number  [id number]    Number of Visits  410    Date for PT Re-Evaluation  03/13/19    Authorization Type  goals last updated on 12/26/18    Authorization Time Period  Medicaid authorization 2x/wk (total of 12 visits): 01/15/19-02/25/19    Authorization - Visit Number  7    Authorization - Number of Visits  12    PT Start Time  1520    PT Stop Time  1600    PT Time Calculation (min)  40 min    Equipment Utilized During Treatment  Gait belt    Activity Tolerance  Patient tolerated treatment well;No increased pain    Behavior During Therapy  [facility] for tasks assessed/performed       History reviewed. No pertinent past medical history.  History reviewed. No pertinent surgical history.  There were no vitals filed for this visit.  Subjective Assessment - 02/06/19 1529    Subjective  Pt reports doing well today. He reports some soreness in his quads after last session, but endorses no pain or soreness today. No new questions or concerns at this time.    Pertinent History  personal factors affecting rehab: younger in age, time since initial injury, high fall risk, good caregiver support, going back to school so limited time available;     How long can you sit comfortably?  NA    How long can you walk comfortably?  2-3 laps around a small track;     Diagnostic tests  None recent;     Patient Stated Goals  be able to pick up his 60 pound boxer/pit bull mix dog, Buster; to be able to get up from the floor without pushing off of an object with his hands.     Currently in Pain?  No/denies         TREATMENT    Ther-ex Quantumsingle  legseated leg press 165# x 10BLE, 180# 2x10on RLE and 2x10 on LLEside; Lungesat edge of therapy mat with a chair with arm rests on the opposite side.  Performed with towel under RLE x10 with CGA provided throughout and intermittent min/modA for LOBs; performed with LLE forward x5 with towel and x5 on airex pad with several LOBs while on towel.  CGA provided throughout with intermittent modAwhen LLE is forward.   Neuromuscular Re-education Basketball forward and retro passes with therapist during gait x 75'; Soccer ball passes with forward and retro gait x 75' each direction;   Pt educated throughout session about proper posture and technique with exercises. Improved exercise technique, movement at target joints, use of target muscles after min to mod verbal, visual, tactile cues.   Ptdemonstrated good motivation throughout today's session.  He responded well to an increase in weight on the leg press today, demonstrating the ability to perform single leg leg press of 180# 2x10 on each leg, however he does not complete the full ROM during the exercise, demonstrating more of a mini-squat technique.  He was also able to progress his come to stand from a 1/2 kneeling position, with the ability to perform 10 reps with the RLE in front and 5  reps with the LLE in front from a towel, however he does require up to [facility] for several LOBs throughout. He will continue to benefit from PT services to address deficits in strength, balance, and mobility in order to return to full function at home and school.           PT Long Term Goals - 12/27/18 0001      PT LONG TERM GOAL #1   Title  Patient will improve 6 min walk test to >2000 feet to allow him to fully navigate the [facility] within appropriate time frames to transition between classes as well as play basketball with his friends (2948' is distance predicted by 6MWT reference equation based on age, gender, height,  and weight)       Baseline  10/05/17: 950 feet on level surface; 01/31/18: 1100 feet on level surface, 03/28/18: 1250 feet; 05/21/18: 1170'; 06/11/18: 1300'; 08/22/2018: 1200 feet R AFO, no AD; 10/01/18: 1225' no AFO, no AD; 10/31/18: 1335' AFO, no AD, cloth facemask, CGA; 12/05/18: 1370' AFO, no AD, cloth facemask, CGA; 12/26/18: 1370' AFO, no AD, cloth facemask, CGA    Time  12    Period  Weeks    Status  Partially Met    Target Date  03/13/19      PT LONG TERM GOAL #2   Title  Patient will improve FGA score by at least 6 points to indicate improved postural control for better balance specifically with head turns during ambulation and backwards walking. These skills are critical for patient to be able to safely scan his environment during ambulation as well as safely participate in recreational activities such as basketball which would require him to regularly perform dynamic head turns and backwards stepping.     Baseline  01/31/18: 18/24, 03/28/18: 19/24; 05/22/18: 20/30; 08/20/2018: 23/30; 10/02/18: 23/30; 10/31/18: 23/30; 12/05/18: 25/30; 12/26/18: 26/30     Time  12    Period  Weeks    Status  Achieved    Target Date  03/13/19      PT LONG TERM GOAL #3   Title  Pt will improve ABC by at least 13%, which will also exceed falls cut-off of 67%, in order to demonstrate clinically significant improvement in balance confidence and decrease his risk for future falls.    Baseline  10/01/18: 64.375%; 10/31/18: 84.69%    Time  12    Period  Weeks    Status  Achieved      PT LONG TERM GOAL #4   Title  Patient will demonstrate floor to/from stand transfer without UE support and no external assistance in the possibly that he needs to perform tasks at floor level or if he needs to stand from the ground after a fall.    Baseline  08/20/2018: Requires L UE support on chair when rising from kneeling; 08/29/2018: required min A hand held assist when rising from tall kneeling; 10/01/18: Unable to transfer to/from floor without  minA+1 assist from therapist as well as LUE support on a surface such as a chair or railing. 11/06/18: Able to perform with rear knee (right) on Airex pad and BUE pressing through L knee, unable to perform from floor. 12/10/18: Able to perform with rear LE (left) on towel inconsistently requiring BUE through RLE to come to stand; 12/26/18: able to perform with L knee on towel inconsistently requiring BUE through RLE to come to stand, unable to perform with R knee on towel without BUE support  on // bars and mod/maxA from PT.     Time  12    Period  Weeks    Status  Partially Met    Target Date  03/13/19      PT LONG TERM GOAL #5   Title  Pt will increase LEFS by at least 9 points in order to demonstrate significant improvement in lower extremity function especially related to running and cutting which will allow him to participate in basketball with his friends.    Baseline  10/01/18: 51/80; 10/31/18: 59/80; 12/10/18: 62/80    Time  12    Period  Weeks    Status  Achieved      Additional Long Term Goals   Additional Long Term Goals  Yes      PT LONG TERM GOAL #6   Title  Pt will improve Mini BESTest by at least 4 points in order to demonstrate clinically significant improvement in his balance especially in the domains of vertical head turns, backwards walking, and eyes closed walking to allow him to safely participate in sports, scan his environment when walking across campus, and decrease his risk for falls in low light environments.     Baseline  10/01/18: 21/28; 10/31/18: 24/28; 12/05/18: 24/28; 12/26/18: 25/28    Time  12    Period  Weeks    Status  Achieved    Target Date  03/13/19            Plan - 02/06/19 1802    Clinical Impression Statement  Pt demonstrated good motivation throughout today's session.  He responded well to an increase in weight on the leg press today, demonstrating the ability to perform single leg leg press of 180# 2x10 on each leg, however he does not complete the full  ROM during the exercise, demonstrating more of a mini-squat technique.  He was also able to progress his come to stand from a 1/2 kneeling position, with the ability to perform 10 reps with the RLE in front and 5 reps with the LLE in front from a towel, however he does require up to [place] for several LOBs throughout. He will continue to benefit from PT services to address deficits in strength, balance, and mobility in order to return to full function at home and school.    Examination-Activity Limitations  Carry;Lift;Squat;Stairs;Caring for Others;Reach Overhead;Bed Mobility;Bend;Transfers;Other   age-appropriate functional activities such as bed mobility, transfers, bending, lifting, carrying, lunging, running, squatting, walking, stairs, athletics, navigation across uneven ground,    Examination-Participation Restrictions  School;Community Activity;Driving;Other   athletics, community and social participation, and pet care   Stability/Clinical Decision Making  Evolving/Moderate complexity    Rehab Potential  Good    Clinical Impairments Affecting Rehab Potential  positive: good caregiver support, young in age, no co-morbidities; Negative: Chronicity, high fall risk; Patient's clinical presentation is stable as he has had no recent falls and has been responding well to conservative treatment;     PT Frequency  2x / week    PT Duration  12 weeks    PT Treatment/Interventions  Cryotherapy;Electrical Stimulation;Moist Heat;Gait training;Neuromuscular re-education;Balance training;Therapeutic exercise;Therapeutic activities;Functional mobility training;Stair training;Patient/family education;Orthotic Fit/Training;Energy conservation;Dry needling;Passive range of motion;Aquatic Therapy;ADLs/Self Care Home Management;DME Instruction;Manual techniques;Vestibular   cryotherapy/moist heat for pt comfort for soreness, relaxation, & body temperature regulation PRN; estim for improved muscle activation, relaxation of  tight muscles, or soreness relief PRN; dry needling for improved soreness, decreased muscle tension PRN   PT Next Visit Plan  Reassess goals  next session.  Come to stand from 1/2 kneeling, Ambulation with ball tossing, pivot turns, stepping over obstacles, jumping, bounding, lunging, floor to/from stand transfers, continue practicing with new AFO    PT Home Exercise Plan  New HEP provided on 12/19/18: [id number]    Consulted and Agree with Plan of Care  Patient       Patient will benefit from skilled therapeutic intervention in order to improve the following deficits and impairments:  Abnormal gait, Decreased cognition, Decreased mobility, Decreased coordination, Decreased activity tolerance, Decreased endurance, Decreased strength, Difficulty walking, Decreased safety awareness, Decreased balance, Impaired perceived functional ability, Impaired UE functional use, Decreased range of motion  Visit Diagnosis: Muscle weakness (generalized)  Other lack of coordination  Unsteadiness on feet     Problem List There are no active problems to display for this patient.   This entire session was performed under direct supervision and direction of a licensed therapist/therapist assistant . I have personally read, edited and approve of the note as written.   Lutricia Horsfall, SPT [facility]out PT, DPT, GCS  Huprich,Jason 02/07/2019, 11:55 AM  [place] MAIN [facility] SERVICES [address] [place], [place], [address] Phone: [phone]   Fax:  [phone]  Name: Edgar [name] MRN: [id number] Date of Birth: 2001/01/29

## 2019-02-11 ENCOUNTER — Encounter: Payer: Self-pay | Admitting: Occupational Therapy

## 2019-02-11 ENCOUNTER — Ambulatory Visit: Payer: BC Managed Care – PPO

## 2019-02-11 ENCOUNTER — Other Ambulatory Visit: Payer: Self-pay

## 2019-02-11 ENCOUNTER — Ambulatory Visit: Payer: BC Managed Care – PPO | Admitting: Occupational Therapy

## 2019-02-11 DIAGNOSIS — R2681 Unsteadiness on feet: Secondary | ICD-10-CM

## 2019-02-11 DIAGNOSIS — M6281 Muscle weakness (generalized): Secondary | ICD-10-CM | POA: Diagnosis not present

## 2019-02-11 DIAGNOSIS — R278 Other lack of coordination: Secondary | ICD-10-CM

## 2019-02-11 NOTE — Therapy (Signed)
[place] MAIN [facility] SERVICES [address] [place], [place], [address] Phone: [phone]   Fax:  [phone]  Physical Therapy Progress Note    Dates of reporting period  12/26/18   to   02/11/19   Patient Details  Name: Edgar [name] MRN: [id number] Date of Birth: 2000/05/01 No data recorded  Encounter Date: 02/11/2019  PT End of Session - 02/11/19 1644    Visit Number  290    Number of Visits  410    Date for PT Re-Evaluation  03/13/19    Authorization Type  goals last updated on 02/11/19    Authorization Time Period  Medicaid authorization 2x/wk (total of 12 visits): 01/15/19-02/25/19    Authorization - Visit Number  8    Authorization - Number of Visits  12    PT Start Time  5732    PT Stop Time  1730    PT Time Calculation (min)  45 min    Equipment Utilized During Treatment  Gait belt    Activity Tolerance  Patient tolerated treatment well;No increased pain    Behavior During Therapy  [facility] for tasks assessed/performed       History reviewed. No pertinent past medical history.  History reviewed. No pertinent surgical history.  There were no vitals filed for this visit.  Subjective Assessment - 02/11/19 1650    Subjective  Pt reports doing well today. He reports no pain or soreness after last session or today.   No new questions or concerns at this time.    Pertinent History  personal factors affecting rehab: younger in age, time since initial injury, high fall risk, good caregiver support, going back to school so limited time available;     How long can you sit comfortably?  NA    How long can you walk comfortably?  2-3 laps around a small track;     Diagnostic tests  None recent;     Patient Stated Goals  be able to pick up his 60 pound boxer/pit bull mix dog, Buster; to be able to get up from the floor without pushing off of an object with his hands.     Currently in Pain?  No/denies         [facility] PT Assessment -  02/11/19 1652      6 Minute Walk- Baseline   6 Minute Walk- Baseline  yes    BP (mmHg)  121/79    HR (bpm)  76    02 Sat (%RA)  100 %    Modified Borg Scale for Dyspnea  0- Nothing at all    Perceived Rate of Exertion (Borg)  6-      6 Minute walk- Post Test   6 Minute Walk Post Test  yes    BP (mmHg)  (!) 162/92    HR (bpm)  146    02 Sat (%RA)  99 %    Modified Borg Scale for Dyspnea  9- Extremely severe    Perceived Rate of Exertion (Borg)  18-      6 minute walk test results    Aerobic Endurance Distance Walked  1385      TREATMENT   Re-evaluated goals:  6MWT: 132f CGA throughout without AFO; wearing a cloth facemask  Floor to stand transfers: able to perform transfers bilaterally with CGA today, utilizing his knee on a towel for cushioning.    Prone > quadruped > tall kneeling > 1/2 kneeling >  stand attempted today, requiring modA to transition from prone to quadruped, minA from quadruped to tall kneeling, and maxA to come to stand.     Pt educated throughout session about proper posture and technique with exercises. Improved exercise technique, movement at target joints, use of target muscles after min to mod verbal, visual, tactile cues.   Ptcontinues to demonstrate excellent motivation throughout his PT sessions.He has improved his 6MWT from 1370 ft at last assessment to 1385 ft today with CGA throughout, still falling significantly below the his age and gender predicted norm of [age]f.  He continues to demonstrate improved strength with standing from floor in half-kneeling position, with the ability to come to stand from a 1/2 kneeling position bilaterally today when in the optimal starting position.  With the ability to come to stand from 1/2 kneeling, he was also assessed on his ability to come to stand from a prone position by transitioning from prone > quadruped > tall kneeling > 1/2 kneeling > stand in order to be able to demonstrate his ability to be able  to independently get up from the floor in the event of a fall.  With this transfer, he required modA to transition from prone to quadruped, minA from quadruped to tall kneeling, and maxA to come to stand due to fatigue following other transitions. This indicates that if he were to fall, he would not be able to independently get up from the floor/ground without assistance.  His HEP will be modified and reassessed next visit due to time constraints today.  He will continue to benefit from PT services to address deficits in strength, balance, and mobility in order to return to full function at home and school.                            PT Long Term Goals - 02/11/19 1706      PT LONG TERM GOAL #1   Title  Patient will improve 6 min walk test to >2000 feet to allow [facility] to [facility] between classes as well as play basketball with his friends (2948' is distance predicted by 6MWT reference equation based on age, gender, height, and weight)       Baseline  10/05/17: 950 feet on level surface; 01/31/18: 1100 feet on level surface, 03/28/18: 1250 feet; 05/21/18: 1170'; 06/11/18: 1300'; 08/22/2018: 1200 feet R AFO, no AD; 10/01/18: 1225' no AFO, no AD; 10/31/18: 1335' AFO, no AD, cloth facemask, CGA; 12/05/18: 1370' AFO, no AD, cloth facemask, CGA; 12/26/18: 1370' AFO, no AD, cloth facemask, CGA; 02/11/19: 1385' without AFO, wearing a cloth facemask, CGA    Time  12    Period  Weeks    Status  Partially Met    Target Date  05/06/19      PT LONG TERM GOAL #2   Title  Patient will improve FGA score by at least 6 points to indicate improved postural control for better balance specifically with head turns during ambulation and backwards walking. These skills are critical for patient to be able to safely scan his environment during ambulation as well as safely participate in recreational activities such as basketball  which would require him to regularly perform dynamic head turns and backwards stepping.     Baseline  01/31/18: 18/24, 03/28/18: 19/24; 05/22/18: 20/30; 08/20/2018: 23/30; 10/02/18: 23/30; 10/31/18: 23/30; 12/05/18: 25/30; 12/26/18: 26/30  Time  12    Period  Weeks    Status  Achieved      PT LONG TERM GOAL #3   Title  Pt will improve ABC by at least 13%, which will also exceed falls cut-off of 67%, in order to demonstrate clinically significant improvement in balance confidence and decrease his risk for future falls.    Baseline  10/01/18: 64.375%; 10/31/18: 84.69%    Time  12    Period  Weeks    Status  Achieved      PT LONG TERM GOAL #4   Title  Patient will demonstrate floor to/from stand transfer without UE support and no external assistance in the possibly that he needs to perform tasks at floor level or if he needs to stand from the ground after a fall.    Baseline  08/20/2018: Requires L UE support on chair when rising from kneeling; 08/29/2018: required min A hand held assist when rising from tall kneeling; 10/01/18: Unable to transfer to/from floor without minA+1 assist from therapist as well as LUE support on a surface such as a chair or railing. 11/06/18: Able to perform with rear knee (right) on Airex pad and BUE pressing through L knee, unable to perform from floor. 12/10/18: Able to perform with rear LE (left) on towel inconsistently requiring BUE through RLE to come to stand; 12/26/18: able to perform with L knee on towel inconsistently requiring BUE through RLE to come to stand, unable to perform with R knee on towel without BUE support on // bars and mod/maxA from PT. ; 02/12/19: able to perform transfers bilaterally with CGA today, utilizing his knee on a towel for cushioning.     Time  12    Period  Weeks    Status  Achieved    Target Date  05/06/19      PT LONG TERM GOAL #5   Title  Pt will increase LEFS by at least 9 points in order to demonstrate significant improvement in lower  extremity function especially related to running and cutting which will allow him to participate in basketball with his friends.    Baseline  10/01/18: 51/80; 10/31/18: 59/80; 12/10/18: 62/80    Time  12    Period  Weeks    Status  Achieved      PT LONG TERM GOAL #6   Title  Pt will improve Mini BESTest by at least 4 points in order to demonstrate clinically significant improvement in his balance especially in the domains of vertical head turns, backwards walking, and eyes closed walking to allow him to safely participate in sports, scan his environment when walking across campus, and decrease his risk for falls in low light environments.     Baseline  10/01/18: 21/28; 10/31/18: 24/28; 12/05/18: 24/28; 12/26/18: 25/28    Time  12    Period  Weeks    Status  Achieved      PT LONG TERM GOAL #7   Title  Patient will demonstrate prone to/from stand transfer without UE support and no external assistance in the possibly that he needs to perform tasks at floor level or if he needs to stand from the ground after a fall.    Baseline  02/11/19: Prone > quadruped > tall kneeling > 1/2 kneeling > stand attempted today, requiring modA to transition from prone to quadruped, minA from quadruped to tall kneeling, and maxA to come to stand.      Time  12  Period  Weeks    Status  New    Target Date  05/06/19            Plan - 02/11/19 1802    Clinical Impression Statement  Pt continues to demonstrate excellent motivation throughout his PT sessions.  He has improved his 6MWT from 1370 ft at last assessment to 1385 ft today with CGA throughout, still falling significantly below the his age and gender predicted norm of [age]f.  He continues to demonstrate improved strength with standing from floor in half-kneeling position, with the ability to come to stand from a 1/2 kneeling position bilaterally today when in the optimal starting position.  With the ability to come to stand from 1/2 kneeling, he was also  assessed on his ability to come to stand from a prone position by transitioning from prone > quadruped > tall kneeling > 1/2 kneeling > stand in order to be able to demonstrate his ability to be able to independently get up from the floor in the event of a fall.  With this transfer, he required modA to transition from prone to quadruped, minA from quadruped to tall kneeling, and maxA to come to stand due to fatigue following other transitions. This indicates that if he were to fall, he would not be able to independently get up from the floor/ground without assistance.  His HEP will be modified and reassessed next visit due to time constraints today.  He will continue to benefit from PT services to address deficits in strength, balance, and mobility in order to return to full function at home and school.    Personal Factors and Comorbidities  Time since onset of injury/illness/exacerbation    Examination-Activity Limitations  Carry;Lift;Squat;Stairs;Caring for Others;Reach Overhead;Bed Mobility;Bend;Transfers;Other   age-appropriate functional activities such as bed mobility, transfers, bending, lifting, carrying, lunging, running, squatting, walking, stairs, athletics, navigation across uneven ground,    Examination-Participation Restrictions  School;Community Activity;Driving;Other   athletics, community and social participation, and pet care   Stability/Clinical Decision Making  Evolving/Moderate complexity    Clinical Decision Making  Moderate    Rehab Potential  Good    Clinical Impairments Affecting Rehab Potential  positive: good caregiver support, young in age, no co-morbidities; Negative: Chronicity, high fall risk;    PT Frequency  2x / week    PT Duration  12 weeks    PT Treatment/Interventions  Cryotherapy;Electrical Stimulation;Moist Heat;Gait training;Neuromuscular re-education;Balance training;Therapeutic exercise;Therapeutic activities;Functional mobility training;Stair  training;Patient/family education;Orthotic Fit/Training;Energy conservation;Dry needling;Passive range of motion;Aquatic Therapy;ADLs/Self Care Home Management;DME Instruction;Manual techniques;Vestibular   cryotherapy/moist heat for pt comfort for soreness, relaxation, & body temperature regulation PRN; estim for improved muscle activation, relaxation of tight muscles, or soreness relief PRN; dry needling for improved soreness, decreased muscle tension PRN   PT Next Visit Plan  Reassess/update HEP.  Come to stand from prone, Ambulation with ball tossing, pivot turns, stepping over obstacles, jumping, bounding, lunging, floor to/from stand transfers, continue practicing with new AFO    PT Home Exercise Plan  New HEP provided on 12/19/18: [id number]    Consulted and Agree with Plan of Care  Patient       Patient will benefit from skilled therapeutic intervention in order to improve the following deficits and impairments:  Abnormal gait, Decreased cognition, Decreased mobility, Decreased coordination, Decreased activity tolerance, Decreased endurance, Decreased strength, Difficulty walking, Decreased safety awareness, Decreased balance, Impaired perceived functional ability, Impaired UE functional use, Decreased range of motion  Visit Diagnosis: Muscle weakness (generalized)  Unsteadiness on  feet  Other lack of coordination     Problem List There are no active problems to display for this patient.   This entire session was performed under direct supervision and direction of a licensed therapist/therapist assistant . I have personally read, edited and approve of the note as written.   Lutricia Horsfall, SPT [facility]out PT, DPT, GCS  Huprich,Jason 02/12/2019, 3:44 PM  [place] MAIN [facility] SERVICES [address] [place], [place], [address] Phone: [phone]   Fax:  [phone]  Name: KENNARD FILDES MRN: [id number] Date of Birth: Jul 27, 2000
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
## 2019-02-13 ENCOUNTER — Other Ambulatory Visit: Payer: Self-pay

## 2019-02-13 ENCOUNTER — Ambulatory Visit: Payer: BC Managed Care – PPO | Admitting: Occupational Therapy

## 2019-02-13 ENCOUNTER — Ambulatory Visit: Payer: BC Managed Care – PPO

## 2019-02-13 ENCOUNTER — Encounter: Payer: Self-pay | Admitting: Occupational Therapy

## 2019-02-13 DIAGNOSIS — M6281 Muscle weakness (generalized): Secondary | ICD-10-CM | POA: Diagnosis not present

## 2019-02-13 DIAGNOSIS — R2681 Unsteadiness on feet: Secondary | ICD-10-CM

## 2019-02-13 DIAGNOSIS — R278 Other lack of coordination: Secondary | ICD-10-CM

## 2019-02-13 NOTE — Therapy (Signed)
[place] MAIN [facility] SERVICES [address] [place], [place], [address] Phone: [phone]   Fax:  [phone]  Physical Therapy Treatment  Patient Details  Name: Edgar [name] MRN: [id number] Date of Birth: 10/25/2000 No data recorded  Encounter Date: 02/13/2019  PT End of Session - 02/13/19 1529    Visit Number  291    Number of Visits  410    Date for PT Re-Evaluation  03/13/19    Authorization Type  goals last updated on 02/11/19    Authorization Time Period  Medicaid authorization 2x/wk (total of 12 visits): 01/15/19-02/25/19    Authorization - Visit Number  9    Authorization - Number of Visits  12    PT Start Time  1520    PT Stop Time  1600    PT Time Calculation (min)  40 min    Equipment Utilized During Treatment  Gait belt    Activity Tolerance  Patient tolerated treatment well;No increased pain    Behavior During Therapy  [facility] for tasks assessed/performed       History reviewed. No pertinent past medical history.  History reviewed. No pertinent surgical history.  There were no vitals filed for this visit.  Subjective Assessment - 02/13/19 1526    Subjective  Pt reports doing well today. He reports bilateral hip pain after last session, but states that it is better today.   No new questions or concerns at this time.    Pertinent History  personal factors affecting rehab: younger in age, time since initial injury, high fall risk, good caregiver support, going back to school so limited time available;     How long can you sit comfortably?  NA    How long can you walk comfortably?  2-3 laps around a small track;     Diagnostic tests  None recent;     Patient Stated Goals  be able to pick up his 60 pound boxer/pit bull mix dog, Buster; to be able to get up from the floor without pushing off of an object with his hands.     Currently in Pain?  No/denies         TREATMENT    Ther-ex X-Ride 5 min L4 during history    Quantumsingle legseated leg press 165# 2x15on RLE and 2x15 on LLEside;  HEP was updated an reviewed with pt today with understanding verbalized and demonstration returned   Neuromuscular Re-education Basketball lateral passes with therapist during gait 75' x 3;  Basketball forward passes with therapist during gait x 75';  Soccer ball passes laterally x 75' each direction;  Standing cross punches into sparring pads to promote trunk rotation and coordination of stepping with a dynamic activity  Standing knee kicks (hip flexion) into sparring pads?2x10 performed bilaterally to promote increased single leg stance time.   Prone on therapy mat working on getting from prone to quadruped; unable to come into a quadruped position without maxA from prone; but able to come into a quadruped with a bosu ball under his stomach with modA at his hips.  x5  Tall kneeling <> sitting back on heels x10 with CGA provided at feet and shoulders in case of LOB.       Pt educated throughout session about proper posture and technique with exercises. Improved exercise technique, movement at target joints, use of target muscles after min to mod verbal, visual, tactile cues.   Ptdemonstrated good motivation throughout today's session.  He responded  well to the intiation of performing cross punches and knee kicks today, demonstrating no LOBs.  He demonstrates anxiety when performing tall kneeling <> sitting back on heels, but is able to perform them correctly after a few attempts.  He demonstrates difficulty with transitioning from prone to quadruped, requiring modA from PT with a bosu ball underneath his stomach.He will continue to benefit from PT services to address deficits in strength, balance, and mobility in order to return to full function at home and school.             PT Long Term Goals - 02/11/19 1706      PT LONG TERM GOAL #1   Title  Patient will improve 6 min walk  test to >2000 feet to allow him to fully navigate the [facility] within appropriate time frames to transition between classes as well as play basketball with his friends (2948' is distance predicted by 6MWT reference equation based on age, gender, height, and weight)       Baseline  10/05/17: 950 feet on level surface; 01/31/18: 1100 feet on level surface, 03/28/18: 1250 feet; 05/21/18: 1170'; 06/11/18: 1300'; 08/22/2018: 1200 feet R AFO, no AD; 10/01/18: 1225' no AFO, no AD; 10/31/18: 1335' AFO, no AD, cloth facemask, CGA; 12/05/18: 1370' AFO, no AD, cloth facemask, CGA; 12/26/18: 1370' AFO, no AD, cloth facemask, CGA; 02/11/19: 1385' without AFO, wearing a cloth facemask, CGA    Time  12    Period  Weeks    Status  Partially Met    Target Date  05/06/19      PT LONG TERM GOAL #2   Title  Patient will improve FGA score by at least 6 points to indicate improved postural control for better balance specifically with head turns during ambulation and backwards walking. These skills are critical for patient to be able to safely scan his environment during ambulation as well as safely participate in recreational activities such as basketball which would require him to regularly perform dynamic head turns and backwards stepping.     Baseline  01/31/18: 18/24, 03/28/18: 19/24; 05/22/18: 20/30; 08/20/2018: 23/30; 10/02/18: 23/30; 10/31/18: 23/30; 12/05/18: 25/30; 12/26/18: 26/30     Time  12    Period  Weeks    Status  Achieved      PT LONG TERM GOAL #3   Title  Pt will improve ABC by at least 13%, which will also exceed falls cut-off of 67%, in order to demonstrate clinically significant improvement in balance confidence and decrease his risk for future falls.    Baseline  10/01/18: 64.375%; 10/31/18: 84.69%    Time  12    Period  Weeks    Status  Achieved      PT LONG TERM GOAL #4   Title  Patient will demonstrate floor to/from stand transfer without UE support and no external assistance in the  possibly that he needs to perform tasks at floor level or if he needs to stand from the ground after a fall.    Baseline  08/20/2018: Requires L UE support on chair when rising from kneeling; 08/29/2018: required min A hand held assist when rising from tall kneeling; 10/01/18: Unable to transfer to/from floor without minA+1 assist from therapist as well as LUE support on a surface such as a chair or railing. 11/06/18: Able to perform with rear knee (right) on Airex pad and BUE pressing through L knee, unable to perform from floor. 12/10/18: Able to perform with  rear LE (left) on towel inconsistently requiring BUE through RLE to come to stand; 12/26/18: able to perform with L knee on towel inconsistently requiring BUE through RLE to come to stand, unable to perform with R knee on towel without BUE support on // bars and mod/maxA from PT. ; 02/12/19: able to perform transfers bilaterally with CGA today, utilizing his knee on a towel for cushioning.     Time  12    Period  Weeks    Status  Achieved    Target Date  05/06/19      PT LONG TERM GOAL #5   Title  Pt will increase LEFS by at least 9 points in order to demonstrate significant improvement in lower extremity function especially related to running and cutting which will allow him to participate in basketball with his friends.    Baseline  10/01/18: 51/80; 10/31/18: 59/80; 12/10/18: 62/80    Time  12    Period  Weeks    Status  Achieved      PT LONG TERM GOAL #6   Title  Pt will improve Mini BESTest by at least 4 points in order to demonstrate clinically significant improvement in his balance especially in the domains of vertical head turns, backwards walking, and eyes closed walking to allow him to safely participate in sports, scan his environment when walking across campus, and decrease his risk for falls in low light environments.     Baseline  10/01/18: 21/28; 10/31/18: 24/28; 12/05/18: 24/28; 12/26/18: 25/28    Time  12    Period  Weeks    Status   Achieved      PT LONG TERM GOAL #7   Title  Patient will demonstrate prone to/from stand transfer without UE support and no external assistance in the possibly that he needs to perform tasks at floor level or if he needs to stand from the ground after a fall.    Baseline  02/11/19: Prone > quadruped > tall kneeling > 1/2 kneeling > stand attempted today, requiring modA to transition from prone to quadruped, minA from quadruped to tall kneeling, and maxA to come to stand.      Time  12    Period  Weeks    Status  New    Target Date  05/06/19            Plan - 02/13/19 1642    Clinical Impression Statement  Pt demonstrated good motivation throughout today's session.  He responded well to the intiation of performing cross punches and knee kicks today, demonstrating no LOBs.  He demonstrates anxiety when performing tall kneeling <> sitting back on heels, but is able to perform them correctly after a few attempts.  He demonstrates difficulty with transitioning from prone to quadruped, requiring modA from PT with a bosu ball underneath his stomach.   He will continue to benefit from PT services to address deficits in strength, balance, and mobility in order to return to full function at home and school.    Personal Factors and Comorbidities  Time since onset of injury/illness/exacerbation    Examination-Activity Limitations  Carry;Lift;Squat;Stairs;Caring for Others;Reach Overhead;Bed Mobility;Bend;Transfers;Other   age-appropriate functional activities such as bed mobility, transfers, bending, lifting, carrying, lunging, running, squatting, walking, stairs, athletics, navigation across uneven ground,    Examination-Participation Restrictions  School;Community Activity;Driving;Other   athletics, community and social participation, and pet care   Stability/Clinical Decision Making  Evolving/Moderate complexity    Rehab Potential  Good  Clinical Impairments Affecting Rehab Potential  positive:  good caregiver support, young in age, no co-morbidities; Negative: Chronicity, high fall risk;    PT Frequency  2x / week    PT Duration  12 weeks    PT Treatment/Interventions  Cryotherapy;Electrical Stimulation;Moist Heat;Gait training;Neuromuscular re-education;Balance training;Therapeutic exercise;Therapeutic activities;Functional mobility training;Stair training;Patient/family education;Orthotic Fit/Training;Energy conservation;Dry needling;Passive range of motion;Aquatic Therapy;ADLs/Self Care Home Management;DME Instruction;Manual techniques;Vestibular   cryotherapy/moist heat for pt comfort for soreness, relaxation, & body temperature regulation PRN; estim for improved muscle activation, relaxation of tight muscles, or soreness relief PRN; dry needling for improved soreness, decreased muscle tension PRN   PT Next Visit Plan  Come to stand from prone, Ambulation with ball tossing, pivot turns, stepping over obstacles, jumping, bounding, lunging, floor to/from stand transfers, continue practicing with new AFO    PT Home Exercise Plan  New HEP provided on 02/13/19: [email or web address]    Consulted and Agree with Plan of Care  Patient       Patient will benefit from skilled therapeutic intervention in order to improve the following deficits and impairments:  Abnormal gait, Decreased cognition, Decreased mobility, Decreased coordination, Decreased activity tolerance, Decreased endurance, Decreased strength, Difficulty walking, Decreased safety awareness, Decreased balance, Impaired perceived functional ability, Impaired UE functional use, Decreased range of motion  Visit Diagnosis: Muscle weakness (generalized)  Unsteadiness on feet     Problem List There are no active problems to display for this patient.   This entire session was performed under direct supervision and direction of a licensed therapist/therapist assistant . I have personally read, edited and approve of the note as written.    Lutricia Horsfall, SPT [facility] PT, DPT, GCS  Huprich,Jason 02/14/2019, 11:35 AM  [place] MAIN [facility] SERVICES [address] [place], [place], [address] Phone: [phone]   Fax:  [phone]  Name: Edgar [name] MRN: [id number] Date of Birth: [birth year]

## 2019-02-13 NOTE — Patient Instructions (Signed)
Access Code: [email or web address]  URL: [email or web address]  Date: 02/13/2019  Prepared by: Roxana Hires   Exercises Squat - 10 reps - 2 sets - 1x daily - 3x weekly Standard Lunge - 10 reps - 2 sets - 1x daily - 3x weekly Lateral Lunge - 10 reps - 2 sets - 1x daily - 3x weekly Lateral Single Leg Lunge Jumps - 10 reps - 2 sets - 1x daily - 3x weekly Tandem Stance in Corner - 3 reps - 30s x 3 with each foot forward hold - 1x daily - 3x weekly Single Leg Stance with Support - 3 reps - 30s x 3 on each leg hold - 1x daily - 3x weekly Standing Heel Raise with Support - 10 reps - 2 sets - 3s hold - 1x daily - 3x weekly Standing Toe Raises at Chair - 10 reps - 2 sets - 3s hold - 1x daily - 3x weekly Prone Press Up on Elbows - 10 reps - 2 sets - 1x daily - 3x weekly Tall Kneeling Hip Hinge - 10 reps - 2 sets - 1x daily - 3x weekly
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
## 2019-02-18 ENCOUNTER — Other Ambulatory Visit: Payer: Self-pay

## 2019-02-18 ENCOUNTER — Ambulatory Visit: Payer: BC Managed Care – PPO | Admitting: Occupational Therapy

## 2019-02-18 ENCOUNTER — Ambulatory Visit: Payer: BC Managed Care – PPO | Attending: Physical Medicine and Rehabilitation

## 2019-02-18 DIAGNOSIS — R2681 Unsteadiness on feet: Secondary | ICD-10-CM | POA: Diagnosis present

## 2019-02-18 DIAGNOSIS — R278 Other lack of coordination: Secondary | ICD-10-CM | POA: Diagnosis present

## 2019-02-18 DIAGNOSIS — M6281 Muscle weakness (generalized): Secondary | ICD-10-CM | POA: Diagnosis not present
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
## 2019-02-18 NOTE — Therapy (Signed)
[place] MAIN [facility] SERVICES [address] [place], [place], [address] Phone: [phone]   Fax:  [phone]  Physical Therapy Treatment  Patient Details  Name: Edgar [name] MRN: [id number] Date of Birth: [birth year] No data recorded  Encounter Date: 02/18/2019  PT End of Session - 02/18/19 1613    Visit Number  323    Number of Visits  410    Date for PT Re-Evaluation  03/13/19    Authorization Type  goals last updated on 02/11/19    Authorization Time Period  Medicaid authorization 2x/wk (total of 12 visits): 01/15/19-02/25/19    Authorization - Visit Number  10    Authorization - Number of Visits  12    PT Start Time  5573    PT Stop Time  2202    PT Time Calculation (min)  40 min    Equipment Utilized During Treatment  Gait belt    Activity Tolerance  Patient tolerated treatment well;No increased pain    Behavior During Therapy  [facility] for tasks assessed/performed       History reviewed. No pertinent past medical history.  History reviewed. No pertinent surgical history.  There were no vitals filed for this visit.  Subjective Assessment - 02/18/19 1611    Subjective  Pt reports doing well today. He endorses no pain or soreness today. He states that his new HEP is going well.  No new questions or concerns at this time.    Pertinent History  personal factors affecting rehab: younger in age, time since initial injury, high fall risk, good caregiver support, going back to school so limited time available;     How long can you sit comfortably?  NA    How long can you walk comfortably?  2-3 laps around a small track;     Diagnostic tests  None recent;     Patient Stated Goals  be able to pick up his 60 pound boxer/pit bull mix dog, Buster; to be able to get up from the floor without pushing off of an object with his hands.     Currently in Pain?  No/denies        TREATMENT  Ther-ex Quantumsingle legseated leg press  180#2x10on RLE and2x15on LLEside; Quantum single leg calf raises 165# 2x10 performed bilaterally    Neuromuscular Re-education Pt demonstrates the ability to transition from prone to supine, and is able to transition from supine into sitting by pulling with his LUE on his R knee.  He is able to transition from a seated position into quadruped twice with multiple attempts in between by rolling towards his left.  He is able to transition into tall kneeling with minA and UE support through a mat table, however is unable to come to stand today from a 1/2 kneeling position due to fatigue today.  He is also unable to transition from supine/sitting into quadruped by rolling to his R.  He was able to complete 2 transfers from supine to tall kneeling.          Pt educated throughout session about proper posture and technique with exercises. Improved exercise technique, movement at target joints, use of target muscles after min to mod verbal, visual, tactile cues.   Ptdemonstrated good motivation throughout today's session.He demonstrates the ability to transition from supine> sitting> quadruped>tall kneeling today by rolling to his left, however it took multiple attempts.  He does require minA/modA for steadying when transitioning from sitting to quadruped, as  well as from quadruped to tall kneeling.  He is unable to transition from sitting to quadruped by rolling to his right.He will continue to benefit from PT services to address deficits in strength, balance, and mobility in order to return to full function at home and school.                     PT Long Term Goals - 02/11/19 1706      PT LONG TERM GOAL #1   Title  Patient will improve 6 min walk test to >2000 feet to allow him to fully navigate the [facility] within appropriate time frames to transition between classes as well as play basketball with his friends (2948' is distance predicted  by 6MWT reference equation based on age, gender, height, and weight)       Baseline  10/05/17: 950 feet on level surface; 01/31/18: 1100 feet on level surface, 03/28/18: 1250 feet; 05/21/18: 1170'; 06/11/18: 1300'; 08/22/2018: 1200 feet R AFO, no AD; 10/01/18: 1225' no AFO, no AD; 10/31/18: 1335' AFO, no AD, cloth facemask, CGA; 12/05/18: 1370' AFO, no AD, cloth facemask, CGA; 12/26/18: 1370' AFO, no AD, cloth facemask, CGA; 02/11/19: 1385' without AFO, wearing a cloth facemask, CGA    Time  12    Period  Weeks    Status  Partially Met    Target Date  05/06/19      PT LONG TERM GOAL #2   Title  Patient will improve FGA score by at least 6 points to indicate improved postural control for better balance specifically with head turns during ambulation and backwards walking. These skills are critical for patient to be able to safely scan his environment during ambulation as well as safely participate in recreational activities such as basketball which would require him to regularly perform dynamic head turns and backwards stepping.     Baseline  01/31/18: 18/24, 03/28/18: 19/24; 05/22/18: 20/30; 08/20/2018: 23/30; 10/02/18: 23/30; 10/31/18: 23/30; 12/05/18: 25/30; 12/26/18: 26/30     Time  12    Period  Weeks    Status  Achieved      PT LONG TERM GOAL #3   Title  Pt will improve ABC by at least 13%, which will also exceed falls cut-off of 67%, in order to demonstrate clinically significant improvement in balance confidence and decrease his risk for future falls.    Baseline  10/01/18: 64.375%; 10/31/18: 84.69%    Time  12    Period  Weeks    Status  Achieved      PT LONG TERM GOAL #4   Title  Patient will demonstrate floor to/from stand transfer without UE support and no external assistance in the possibly that he needs to perform tasks at floor level or if he needs to stand from the ground after a fall.    Baseline  08/20/2018: Requires L UE support on chair when rising from kneeling; 08/29/2018: required min A hand held  assist when rising from tall kneeling; 10/01/18: Unable to transfer to/from floor without minA+1 assist from therapist as well as LUE support on a surface such as a chair or railing. 11/06/18: Able to perform with rear knee (right) on Airex pad and BUE pressing through L knee, unable to perform from floor. 12/10/18: Able to perform with rear LE (left) on towel inconsistently requiring BUE through RLE to come to stand; 12/26/18: able to perform with L knee on towel inconsistently requiring BUE through RLE to come  to stand, unable to perform with R knee on towel without BUE support on // bars and mod/maxA from PT. ; 02/12/19: able to perform transfers bilaterally with CGA today, utilizing his knee on a towel for cushioning.     Time  12    Period  Weeks    Status  Achieved    Target Date  05/06/19      PT LONG TERM GOAL #5   Title  Pt will increase LEFS by at least 9 points in order to demonstrate significant improvement in lower extremity function especially related to running and cutting which will allow him to participate in basketball with his friends.    Baseline  10/01/18: 51/80; 10/31/18: 59/80; 12/10/18: 62/80    Time  12    Period  Weeks    Status  Achieved      PT LONG TERM GOAL #6   Title  Pt will improve Mini BESTest by at least 4 points in order to demonstrate clinically significant improvement in his balance especially in the domains of vertical head turns, backwards walking, and eyes closed walking to allow him to safely participate in sports, scan his environment when walking across campus, and decrease his risk for falls in low light environments.     Baseline  10/01/18: 21/28; 10/31/18: 24/28; 12/05/18: 24/28; 12/26/18: 25/28    Time  12    Period  Weeks    Status  Achieved      PT LONG TERM GOAL #7   Title  Patient will demonstrate prone to/from stand transfer without UE support and no external assistance in the possibly that he needs to perform tasks at floor level or if he needs to stand  from the ground after a fall.    Baseline  02/11/19: Prone > quadruped > tall kneeling > 1/2 kneeling > stand attempted today, requiring modA to transition from prone to quadruped, minA from quadruped to tall kneeling, and maxA to come to stand.      Time  12    Period  Weeks    Status  New    Target Date  05/06/19            Plan - 02/18/19 1716    Clinical Impression Statement  Pt demonstrated good motivation throughout today's session.  He demonstrates the ability to transition from supine> sitting> quadruped>tall kneeling today by rolling to his left, however it took multiple attempts.  He does require minA/modA for steadying when transitioning from sitting to quadruped, as well as from quadruped to tall kneeling.  He is unable to transition from sitting to quadruped by rolling to his right.  He will continue to benefit from PT services to address deficits in strength, balance, and mobility in order to return to full function at home and school.    Personal Factors and Comorbidities  Time since onset of injury/illness/exacerbation    Examination-Activity Limitations  Carry;Lift;Squat;Stairs;Caring for Others;Reach Overhead;Bed Mobility;Bend;Transfers;Other   age-appropriate functional activities such as bed mobility, transfers, bending, lifting, carrying, lunging, running, squatting, walking, stairs, athletics, navigation across uneven ground,    Examination-Participation Restrictions  School;Community Activity;Driving;Other   athletics, community and social participation, and pet care   Stability/Clinical Decision Making  Evolving/Moderate complexity    Rehab Potential  Good    Clinical Impairments Affecting Rehab Potential  positive: good caregiver support, young in age, no co-morbidities; Negative: Chronicity, high fall risk;    PT Frequency  2x / week    PT Duration  12 weeks    PT Treatment/Interventions  Cryotherapy;Electrical Stimulation;Moist Heat;Gait training;Neuromuscular  re-education;Balance training;Therapeutic exercise;Therapeutic activities;Functional mobility training;Stair training;Patient/family education;Orthotic Fit/Training;Energy conservation;Dry needling;Passive range of motion;Aquatic Therapy;ADLs/Self Care Home Management;DME Instruction;Manual techniques;Vestibular   cryotherapy/moist heat for pt comfort for soreness, relaxation, & body temperature regulation PRN; estim for improved muscle activation, relaxation of tight muscles, or soreness relief PRN; dry needling for improved soreness, decreased muscle tension PRN   PT Next Visit Plan  Come to stand from prone, Ambulation with ball tossing, pivot turns, stepping over obstacles, jumping, bounding, lunging, floor to/from stand transfers, continue practicing with new AFO    PT Home Exercise Plan  New HEP provided on 02/13/19: [id number]    Consulted and Agree with Plan of Care  Patient       Patient will benefit from skilled therapeutic intervention in order to improve the following deficits and impairments:  Abnormal gait, Decreased cognition, Decreased mobility, Decreased coordination, Decreased activity tolerance, Decreased endurance, Decreased strength, Difficulty walking, Decreased safety awareness, Decreased balance, Impaired perceived functional ability, Impaired UE functional use, Decreased range of motion  Visit Diagnosis: Muscle weakness (generalized)  Unsteadiness on feet     Problem List There are no active problems to display for this patient.   This entire session was performed under direct supervision and direction of a licensed therapist/therapist assistant . I have personally read, edited and approve of the note as written.   Lutricia Horsfall, SPT Phillips Grout PT, DPT, GCS  Huprich,Jason 02/19/2019, 10:04 AM  [place] MAIN [facility] SERVICES [address] [place], [place], [address] Phone: [phone]   Fax:  [phone]  Name: Edgar [name] MRN: [id number] Date of Birth: 2000-08-29

## 2019-02-20 ENCOUNTER — Encounter: Payer: Self-pay | Admitting: Occupational Therapy

## 2019-02-20 ENCOUNTER — Ambulatory Visit: Payer: BC Managed Care – PPO

## 2019-02-20 ENCOUNTER — Other Ambulatory Visit: Payer: Self-pay

## 2019-02-20 ENCOUNTER — Ambulatory Visit: Payer: BC Managed Care – PPO | Admitting: Occupational Therapy

## 2019-02-20 DIAGNOSIS — R278 Other lack of coordination: Secondary | ICD-10-CM

## 2019-02-20 DIAGNOSIS — R2681 Unsteadiness on feet: Secondary | ICD-10-CM

## 2019-02-20 DIAGNOSIS — M6281 Muscle weakness (generalized): Secondary | ICD-10-CM
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
## 2019-02-20 NOTE — Therapy (Signed)
[place] MAIN [facility] SERVICES [address] [place], [place], [address] Phone: [phone]   Fax:  [phone]  Physical Therapy Treatment  Patient Details  Name: Edgar [name] MRN: [id number] Date of Birth: 04-13-2001 No data recorded  Encounter Date: 02/20/2019  PT End of Session - 02/20/19 1615    Visit Number  293    Number of Visits  410    Date for PT Re-Evaluation  03/13/19    Authorization Type  goals last updated on 02/11/19    Authorization Time Period  Medicaid authorization 2x/wk (total of 12 visits): 01/15/19-02/25/19    Authorization - Visit Number  11    Authorization - Number of Visits  12    PT Start Time  6283    PT Stop Time  6629    PT Time Calculation (min)  35 min    Equipment Utilized During Treatment  Gait belt    Activity Tolerance  Patient tolerated treatment well;No increased pain    Behavior During Therapy  [facility] for tasks assessed/performed       History reviewed. No pertinent past medical history.  History reviewed. No pertinent surgical history.  There were no vitals filed for this visit.  Subjective Assessment - 02/20/19 1616    Subjective  Pt reports doing well today. He endorses no pain or soreness today or after last session. He states that his new HEP is going well and that the new exercises are becoming easier.  No new questions or concerns at this time.    Pertinent History  personal factors affecting rehab: younger in age, time since initial injury, high fall risk, good caregiver support, going back to school so limited time available;     How long can you sit comfortably?  NA    How long can you walk comfortably?  2-3 laps around a small track;     Diagnostic tests  None recent;     Patient Stated Goals  be able to pick up his 60 pound boxer/pit bull mix dog, Buster; to be able to get up from the floor without pushing off of an object with his hands.     Currently in Pain?  No/denies           TREATMENT   Ther-ex Quantumsingle legseated leg press180#2x20on RLE and2x20on LLEside;  Quantum single leg calf raises 180# 2x20 performed bilaterally    Neuromuscular Re-education Basketball forward and retrogait dribbling 43' in each direction;  Basketball sidestep dribbling 20' in each direction;  Soccer ball self passes with forward gait  2 x75'   Soccer ball self passes with retro gait were attempted for 20' but were too difficult to maintain without losing the ball today.   Standing soccer passes in the double door frame x15 stopping the ball from getting past him to encourage stepping patterns and SLS. CGA provided throughout.      Pt was late to today's session, howeverdemonstrated excellent motivation throughout.  He demonstrated the ability to increase his weight and reps on the Quantum leg press during leg press and calf raises today, indicating that his strength and endurance continue to improve.  He demonstrates difficulty with retrogait dual task activities today involving basketball and soccer dribbling.He will continue to benefit from PT services to address deficits in strength, balance, and mobility in order to return to full function at home and school.  PT Long Term Goals - 02/11/19 1706      PT LONG TERM GOAL #1   Title  Patient will improve 6 min walk test to >2000 feet to allow him to fully navigate the [facility] within appropriate time frames to transition between classes as well as play basketball with his friends (2948' is distance predicted by 6MWT reference equation based on age, gender, height, and weight)       Baseline  10/05/17: 950 feet on level surface; 01/31/18: 1100 feet on level surface, 03/28/18: 1250 feet; 05/21/18: 1170'; 06/11/18: 1300'; 08/22/2018: 1200 feet R AFO, no AD; 10/01/18: 1225' no AFO, no AD; 10/31/18: 1335' AFO, no AD, cloth facemask, CGA;  12/05/18: 1370' AFO, no AD, cloth facemask, CGA; 12/26/18: 1370' AFO, no AD, cloth facemask, CGA; 02/11/19: 1385' without AFO, wearing a cloth facemask, CGA    Time  12    Period  Weeks    Status  Partially Met    Target Date  05/06/19      PT LONG TERM GOAL #2   Title  Patient will improve FGA score by at least 6 points to indicate improved postural control for better balance specifically with head turns during ambulation and backwards walking. These skills are critical for patient to be able to safely scan his environment during ambulation as well as safely participate in recreational activities such as basketball which would require him to regularly perform dynamic head turns and backwards stepping.     Baseline  01/31/18: 18/24, 03/28/18: 19/24; 05/22/18: 20/30; 08/20/2018: 23/30; 10/02/18: 23/30; 10/31/18: 23/30; 12/05/18: 25/30; 12/26/18: 26/30     Time  12    Period  Weeks    Status  Achieved      PT LONG TERM GOAL #3   Title  Pt will improve ABC by at least 13%, which will also exceed falls cut-off of 67%, in order to demonstrate clinically significant improvement in balance confidence and decrease his risk for future falls.    Baseline  10/01/18: 64.375%; 10/31/18: 84.69%    Time  12    Period  Weeks    Status  Achieved      PT LONG TERM GOAL #4   Title  Patient will demonstrate floor to/from stand transfer without UE support and no external assistance in the possibly that he needs to perform tasks at floor level or if he needs to stand from the ground after a fall.    Baseline  08/20/2018: Requires L UE support on chair when rising from kneeling; 08/29/2018: required min A hand held assist when rising from tall kneeling; 10/01/18: Unable to transfer to/from floor without minA+1 assist from therapist as well as LUE support on a surface such as a chair or railing. 11/06/18: Able to perform with rear knee (right) on Airex pad and BUE pressing through L knee, unable to perform from floor. 12/10/18: Able to  perform with rear LE (left) on towel inconsistently requiring BUE through RLE to come to stand; 12/26/18: able to perform with L knee on towel inconsistently requiring BUE through RLE to come to stand, unable to perform with R knee on towel without BUE support on // bars and mod/maxA from PT. ; 02/12/19: able to perform transfers bilaterally with CGA today, utilizing his knee on a towel for cushioning.     Time  12    Period  Weeks    Status  Achieved    Target Date  05/06/19  PT LONG TERM GOAL #5   Title  Pt will increase LEFS by at least 9 points in order to demonstrate significant improvement in lower extremity function especially related to running and cutting which will allow him to participate in basketball with his friends.    Baseline  10/01/18: 51/80; 10/31/18: 59/80; 12/10/18: 62/80    Time  12    Period  Weeks    Status  Achieved      PT LONG TERM GOAL #6   Title  Pt will improve Mini BESTest by at least 4 points in order to demonstrate clinically significant improvement in his balance especially in the domains of vertical head turns, backwards walking, and eyes closed walking to allow him to safely participate in sports, scan his environment when walking across campus, and decrease his risk for falls in low light environments.     Baseline  10/01/18: 21/28; 10/31/18: 24/28; 12/05/18: 24/28; 12/26/18: 25/28    Time  12    Period  Weeks    Status  Achieved      PT LONG TERM GOAL #7   Title  Patient will demonstrate prone to/from stand transfer without UE support and no external assistance in the possibly that he needs to perform tasks at floor level or if he needs to stand from the ground after a fall.    Baseline  02/11/19: Prone > quadruped > tall kneeling > 1/2 kneeling > stand attempted today, requiring modA to transition from prone to quadruped, minA from quadruped to tall kneeling, and maxA to come to stand.      Time  12    Period  Weeks    Status  New    Target Date  05/06/19             Plan - 02/20/19 1751    Clinical Impression Statement  Pt was late to today's session, however demonstrated excellent motivation throughout.  He demonstrated the ability to increase his weight and reps on the Quantum leg press during leg press and calf raises today, indicating that his strength and endurance continue to improve.  He demonstrates difficulty with retrogait dual task activities today involving basketball and soccer dribbling. He will continue to benefit from PT services to address deficits in strength, balance, and mobility in order to return to full function at home and school.    Personal Factors and Comorbidities  Time since onset of injury/illness/exacerbation    Examination-Activity Limitations  Carry;Lift;Squat;Stairs;Caring for Others;Reach Overhead;Bed Mobility;Bend;Transfers;Other   age-appropriate functional activities such as bed mobility, transfers, bending, lifting, carrying, lunging, running, squatting, walking, stairs, athletics, navigation across uneven ground,    Examination-Participation Restrictions  School;Community Activity;Driving;Other   athletics, community and social participation, and pet care   Stability/Clinical Decision Making  Evolving/Moderate complexity    Rehab Potential  Good    Clinical Impairments Affecting Rehab Potential  positive: good caregiver support, young in age, no co-morbidities; Negative: Chronicity, high fall risk;    PT Frequency  2x / week    PT Duration  12 weeks    PT Treatment/Interventions  Cryotherapy;Electrical Stimulation;Moist Heat;Gait training;Neuromuscular re-education;Balance training;Therapeutic exercise;Therapeutic activities;Functional mobility training;Stair training;Patient/family education;Orthotic Fit/Training;Energy conservation;Dry needling;Passive range of motion;Aquatic Therapy;ADLs/Self Care Home Management;DME Instruction;Manual techniques;Vestibular   cryotherapy/moist heat for pt comfort for  soreness, relaxation, & body temperature regulation PRN; estim for improved muscle activation, relaxation of tight muscles, or soreness relief PRN; dry needling for improved soreness, decreased muscle tension PRN   PT Next Visit Plan  Come to stand from prone, Ambulation with ball tossing, pivot turns, stepping over obstacles, jumping, bounding, lunging, floor to/from stand transfers, continue practicing with new AFO    PT Home Exercise Plan  New HEP provided on 02/13/19: [id number]    Consulted and Agree with Plan of Care  Patient       Patient will benefit from skilled therapeutic intervention in order to improve the following deficits and impairments:  Abnormal gait, Decreased cognition, Decreased mobility, Decreased coordination, Decreased activity tolerance, Decreased endurance, Decreased strength, Difficulty walking, Decreased safety awareness, Decreased balance, Impaired perceived functional ability, Impaired UE functional use, Decreased range of motion  Visit Diagnosis: Muscle weakness (generalized)  Unsteadiness on feet     Problem List There are no active problems to display for this patient.   This entire session was performed under direct supervision and direction of a licensed therapist/therapist assistant . I have personally read, edited and approve of the note as written.   Lutricia Horsfall, SPT [facility]out PT, DPT, GCS  Huprich,Jason 02/21/2019, 2:58 PM  [place] MAIN [facility] SERVICES [address] [place], [place], [address] Phone: [phone]   Fax:  [phone]  Name: Edgar [name] MRN: [id number] Date of Birth: October 06, 2000

## 2019-02-25 ENCOUNTER — Ambulatory Visit: Payer: BC Managed Care – PPO | Admitting: Occupational Therapy

## 2019-02-25 ENCOUNTER — Ambulatory Visit: Payer: BC Managed Care – PPO

## 2019-02-25 ENCOUNTER — Other Ambulatory Visit: Payer: Self-pay

## 2019-02-25 DIAGNOSIS — M6281 Muscle weakness (generalized): Secondary | ICD-10-CM | POA: Diagnosis not present

## 2019-02-25 DIAGNOSIS — R2681 Unsteadiness on feet: Secondary | ICD-10-CM

## 2019-02-25 DIAGNOSIS — R278 Other lack of coordination: Secondary | ICD-10-CM

## 2019-02-25 NOTE — Therapy (Signed)
[place] MAIN [facility] SERVICES [address] [place], [place], [address] Phone: [phone]   Fax:  [phone]  Physical Therapy Treatment  Patient Details  Name: Edgar [name] MRN: [id number] Date of Birth: 10/01/2000 No data recorded  Encounter Date: 02/25/2019  PT End of Session - 02/25/19 1641    Visit Number  294    Number of Visits  410    Date for PT Re-Evaluation  03/13/19    Authorization Type  goals last updated on 02/11/19    Authorization Time Period  Medicaid authorization 1x/wk (total of 12 visits): 01/15/19-02/25/19    Authorization - Visit Number  12    Authorization - Number of Visits  12    PT Start Time  9379    PT Stop Time  0240    PT Time Calculation (min)  40 min    Equipment Utilized During Treatment  Gait belt    Activity Tolerance  Patient tolerated treatment well;No increased pain    Behavior During Therapy  [facility] for tasks assessed/performed       History reviewed. No pertinent past medical history.  History reviewed. No pertinent surgical history.  There were no vitals filed for this visit.  Subjective Assessment - 02/25/19 1609    Subjective  Pt reports doing well today. He endorses no pain or soreness today, but does report soreness after last session. He states that his new HEP is going well and that the new exercises are becoming easier.  No new questions or concerns at this time.    Pertinent History  personal factors affecting rehab: younger in age, time since initial injury, high fall risk, good caregiver support, going back to school so limited time available;     How long can you sit comfortably?  NA    How long can you walk comfortably?  2-3 laps around a small track;     Diagnostic tests  None recent;     Patient Stated Goals  be able to pick up his 60 pound boxer/pit bull mix dog, Buster; to be able to get up from the floor without pushing off of an object with his hands.     Currently in Pain?   No/denies         TREATMENT   Ther-ex Octane X-Ride 5 min during history  Quantumsingle legseated leg press195#x10, 210# x10 performed bilaterally  Quantum single leg calf raises 195# 2x20 performed bilaterally   Neuromuscular Re-education Basketball forward and retrogait dribbling 75' in each direction;  2 floor to stand transfers (prone > supine> sitting > tall kneeling > 1/2 kneeling> stand): Pt demonstrates improved ability to transfer from prone to a stand, however he does require CGA throughout and intermittent SUE support on a therapy mat for steadying.  He is more successful transferring to his L, and finds success with tucking is LLE under his RLE in a criss cross seated position.       Pt educated throughout session about proper posture and technique with exercises. Improved exercise technique, movement at target joints, use of target muscles after min to mod verbal, visual, tactile cues    Pt continues to demonstrate excellent motivation throughout today's PT session.  He demonstrates improvements in his ability to perform a floor to stand transfer today, however still requires CGA and intermittent steadying and support from a mat table in order to get proper foot and leg placement and to complete a stand transfer. He continues to  demonstrate the ability to increase his weight and reps on the Quantum leg press during leg press and calf raises.  He will continue to benefit from PT services to address deficits in strength, balance, and mobility in order to return to full function at home and school.              PT Long Term Goals - 02/11/19 1706      PT LONG TERM GOAL #1   Title  Patient will improve 6 min walk test to >2000 feet to allow him to fully navigate the [facility] within appropriate time frames to transition between classes as well as play basketball with his friends (2948' is distance predicted by 6MWT  reference equation based on age, gender, height, and weight)       Baseline  10/05/17: 950 feet on level surface; 01/31/18: 1100 feet on level surface, 03/28/18: 1250 feet; 05/21/18: 1170'; 06/11/18: 1300'; 08/22/2018: 1200 feet R AFO, no AD; 10/01/18: 1225' no AFO, no AD; 10/31/18: 1335' AFO, no AD, cloth facemask, CGA; 12/05/18: 1370' AFO, no AD, cloth facemask, CGA; 12/26/18: 1370' AFO, no AD, cloth facemask, CGA; 02/11/19: 1385' without AFO, wearing a cloth facemask, CGA    Time  12    Period  Weeks    Status  Partially Met    Target Date  05/06/19      PT LONG TERM GOAL #2   Title  Patient will improve FGA score by at least 6 points to indicate improved postural control for better balance specifically with head turns during ambulation and backwards walking. These skills are critical for patient to be able to safely scan his environment during ambulation as well as safely participate in recreational activities such as basketball which would require him to regularly perform dynamic head turns and backwards stepping.     Baseline  01/31/18: 18/24, 03/28/18: 19/24; 05/22/18: 20/30; 08/20/2018: 23/30; 10/02/18: 23/30; 10/31/18: 23/30; 12/05/18: 25/30; 12/26/18: 26/30     Time  12    Period  Weeks    Status  Achieved      PT LONG TERM GOAL #3   Title  Pt will improve ABC by at least 13%, which will also exceed falls cut-off of 67%, in order to demonstrate clinically significant improvement in balance confidence and decrease his risk for future falls.    Baseline  10/01/18: 64.375%; 10/31/18: 84.69%    Time  12    Period  Weeks    Status  Achieved      PT LONG TERM GOAL #4   Title  Patient will demonstrate floor to/from stand transfer without UE support and no external assistance in the possibly that he needs to perform tasks at floor level or if he needs to stand from the ground after a fall.    Baseline  08/20/2018: Requires L UE support on chair when rising from kneeling; 08/29/2018: required min A hand held assist  when rising from tall kneeling; 10/01/18: Unable to transfer to/from floor without minA+1 assist from therapist as well as LUE support on a surface such as a chair or railing. 11/06/18: Able to perform with rear knee (right) on Airex pad and BUE pressing through L knee, unable to perform from floor. 12/10/18: Able to perform with rear LE (left) on towel inconsistently requiring BUE through RLE to come to stand; 12/26/18: able to perform with L knee on towel inconsistently requiring BUE through RLE to come to stand, unable to perform with R  knee on towel without BUE support on // bars and mod/maxA from PT. ; 02/12/19: able to perform transfers bilaterally with CGA today, utilizing his knee on a towel for cushioning.     Time  12    Period  Weeks    Status  Achieved    Target Date  05/06/19      PT LONG TERM GOAL #5   Title  Pt will increase LEFS by at least 9 points in order to demonstrate significant improvement in lower extremity function especially related to running and cutting which will allow him to participate in basketball with his friends.    Baseline  10/01/18: 51/80; 10/31/18: 59/80; 12/10/18: 62/80    Time  12    Period  Weeks    Status  Achieved      PT LONG TERM GOAL #6   Title  Pt will improve Mini BESTest by at least 4 points in order to demonstrate clinically significant improvement in his balance especially in the domains of vertical head turns, backwards walking, and eyes closed walking to allow him to safely participate in sports, scan his environment when walking across campus, and decrease his risk for falls in low light environments.     Baseline  10/01/18: 21/28; 10/31/18: 24/28; 12/05/18: 24/28; 12/26/18: 25/28    Time  12    Period  Weeks    Status  Achieved      PT LONG TERM GOAL #7   Title  Patient will demonstrate prone to/from stand transfer without UE support and no external assistance in the possibly that he needs to perform tasks at floor level or if he needs to stand from the  ground after a fall.    Baseline  02/11/19: Prone > quadruped > tall kneeling > 1/2 kneeling > stand attempted today, requiring modA to transition from prone to quadruped, minA from quadruped to tall kneeling, and maxA to come to stand.      Time  12    Period  Weeks    Status  New    Target Date  05/06/19            Plan - 02/26/19 1218    Clinical Impression Statement  Pt continues to demonstrate excellent motivation throughout today's PT session.  He demonstrates improvements in his ability to perform a floor to stand transfer today, however still requires CGA and intermittent steadying and support from a mat table in order to get proper foot and leg placement and to complete a stand transfer. He continues to demonstrate the ability to increase his weight and reps on the Quantum leg press during leg press and calf raises.  He will continue to benefit from PT services to address deficits in strength, balance, and mobility in order to return to full function at home and school.    Personal Factors and Comorbidities  Time since onset of injury/illness/exacerbation    Examination-Activity Limitations  Carry;Lift;Squat;Stairs;Caring for Others;Reach Overhead;Bed Mobility;Bend;Transfers;Other   age-appropriate functional activities such as bed mobility, transfers, bending, lifting, carrying, lunging, running, squatting, walking, stairs, athletics, navigation across uneven ground,    Examination-Participation Restrictions  School;Community Activity;Driving;Other   athletics, community and social participation, and pet care   Stability/Clinical Decision Making  Evolving/Moderate complexity    Rehab Potential  Good    Clinical Impairments Affecting Rehab Potential  positive: good caregiver support, young in age, no co-morbidities; Negative: Chronicity, high fall risk;    PT Frequency  2x / week  PT Duration  12 weeks    PT Treatment/Interventions  Cryotherapy;Electrical Stimulation;Moist  Heat;Gait training;Neuromuscular re-education;Balance training;Therapeutic exercise;Therapeutic activities;Functional mobility training;Stair training;Patient/family education;Orthotic Fit/Training;Energy conservation;Dry needling;Passive range of motion;Aquatic Therapy;ADLs/Self Care Home Management;DME Instruction;Manual techniques;Vestibular   cryotherapy/moist heat for pt comfort for soreness, relaxation, & body temperature regulation PRN; estim for improved muscle activation, relaxation of tight muscles, or soreness relief PRN; dry needling for improved soreness, decreased muscle tension PRN   PT Next Visit Plan  Come to stand from prone, Ambulation with ball tossing, pivot turns, stepping over obstacles, jumping, bounding, lunging, floor to/from stand transfers, continue practicing with new AFO    PT Home Exercise Plan  New HEP provided on 02/13/19: [email or web address]    Consulted and Agree with Plan of Care  Patient       Patient will benefit from skilled therapeutic intervention in order to improve the following deficits and impairments:  Abnormal gait, Decreased cognition, Decreased mobility, Decreased coordination, Decreased activity tolerance, Decreased endurance, Decreased strength, Difficulty walking, Decreased safety awareness, Decreased balance, Impaired perceived functional ability, Impaired UE functional use, Decreased range of motion  Visit Diagnosis: Muscle weakness (generalized)  Unsteadiness on feet  Other lack of coordination     Problem List There are no active problems to display for this patient.   This entire session was performed under direct supervision and direction of a licensed therapist/therapist assistant . I have personally read, edited and approve of the note as written.   Lutricia Horsfall, SPT [facility]out PT, DPT, GCS  Huprich,Jason 02/26/2019, 2:46 PM  [place] MAIN [facility] SERVICES [address] [place], [place],  [address] Phone: [phone]   Fax:  [phone]  Name: Edgar [name] MRN: [id number] Date of Birth: 08-25-2000
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
## 2019-02-27 ENCOUNTER — Encounter: Payer: Self-pay | Admitting: Occupational Therapy

## 2019-02-27 ENCOUNTER — Ambulatory Visit: Payer: BC Managed Care – PPO | Admitting: Occupational Therapy

## 2019-02-27 ENCOUNTER — Ambulatory Visit: Payer: BC Managed Care – PPO

## 2019-02-27 ENCOUNTER — Other Ambulatory Visit: Payer: Self-pay

## 2019-02-27 DIAGNOSIS — R278 Other lack of coordination: Secondary | ICD-10-CM

## 2019-02-27 DIAGNOSIS — R2681 Unsteadiness on feet: Secondary | ICD-10-CM

## 2019-02-27 DIAGNOSIS — M6281 Muscle weakness (generalized): Secondary | ICD-10-CM | POA: Diagnosis not present

## 2019-02-27 NOTE — Therapy (Signed)
[place] MAIN [facility] SERVICES [address] [place], [place], [address] Phone: [phone]   Fax:  [phone]  Physical Therapy Treatment  Patient Details  Name: Edgar [name] MRN: [id number] Date of Birth: 08/21/2000 No data recorded  Encounter Date: 02/27/2019  PT End of Session - 02/27/19 1606    Visit Number  [id number]    Number of Visits  410    Date for PT Re-Evaluation  03/13/19    Authorization Type  goals last updated on 02/11/19    Authorization Time Period  Medicaid authorization 1x/wk for 12 weeks (total of 12 visits): 02/27/19-05/20/18    Authorization - Visit Number  1    Authorization - Number of Visits  12    PT Start Time  0321    PT Stop Time  2248    PT Time Calculation (min)  48 min    Equipment Utilized During Treatment  Gait belt    Activity Tolerance  Patient tolerated treatment well;No increased pain    Behavior During Therapy  [facility] for tasks assessed/performed       History reviewed. No pertinent past medical history.  History reviewed. No pertinent surgical history.  There were no vitals filed for this visit.  Subjective Assessment - 02/27/19 1602    Subjective  Pt reports doing well today. He reports some L shoulder pain today from "sleeping on it wrong." He rates the pain as a 4/10. He reports some soreness after the last therapy session but not excessive. He states that his new HEP is going well. No new questions or concerns at this time.    Pertinent History  personal factors affecting rehab: younger in age, time since initial injury, high fall risk, good caregiver support, going back to school so limited time available;     How long can you sit comfortably?  NA    How long can you walk comfortably?  2-3 laps around a small track;     Diagnostic tests  None recent;     Patient Stated Goals  be able to pick up his 60 pound boxer/pit bull mix dog, Buster; to be able to get up from the floor without pushing off of  an object with his hands.     Currently in Pain?  Yes    Pain Score  4     Pain Location  Shoulder    Pain Orientation  Left    Pain Descriptors / Indicators  Sharp    Pain Type  Acute pain    Pain Onset  Today    Pain Frequency  Intermittent           TREATMENT   Neuromuscular Re-education Octane X-Ride L4 x 5 min during history; Reverse lunges without UE support knee to floor x 10 on each side with UE support on leading knee but no external support other than CGA from therapist; Trampoline jumping x 2 minutes; Broad jumps in // bars challenging distance x 4 lengths; Agility ladder training with forward, lateral, and zig-zag patters x multiple bouts each; Basketballforward and retrogait dribbling 75'in each direction; Basketball crouching gait with attempts to "steal" the ball from PT x 75';    Pt educated throughout session about proper posture and technique with exercises. Improved exercise technique, movement at target joints, use of target muscles after min to mod verbal, visual, tactile cues   Pt continues to demonstrateexcellentmotivation throughout today's PT session.  He continues to demonstrate improved LE strength  during reverse lunges. He also demonsrates improved coordination and speed on agility ladder today.He will continue to benefit from PT services to address deficits in strength, balance, and mobility in order to return to full function at home and school.                         PT Long Term Goals - 02/11/19 1706      PT LONG TERM GOAL #1   Title  Patient will improve 6 min walk test to >2000 feet to allow him to fully navigate the [facility] within appropriate time frames to transition between classes as well as play basketball with his friends (2948' is distance predicted by 6MWT reference equation based on age, gender, height, and weight)       Baseline  10/05/17: 950 feet on level surface;  01/31/18: 1100 feet on level surface, 03/28/18: 1250 feet; 05/21/18: 1170'; 06/11/18: 1300'; 08/22/2018: 1200 feet R AFO, no AD; 10/01/18: 1225' no AFO, no AD; 10/31/18: 1335' AFO, no AD, cloth facemask, CGA; 12/05/18: 1370' AFO, no AD, cloth facemask, CGA; 12/26/18: 1370' AFO, no AD, cloth facemask, CGA; 02/11/19: 1385' without AFO, wearing a cloth facemask, CGA    Time  12    Period  Weeks    Status  Partially Met    Target Date  05/06/19      PT LONG TERM GOAL #2   Title  Patient will improve FGA score by at least 6 points to indicate improved postural control for better balance specifically with head turns during ambulation and backwards walking. These skills are critical for patient to be able to safely scan his environment during ambulation as well as safely participate in recreational activities such as basketball which would require him to regularly perform dynamic head turns and backwards stepping.     Baseline  01/31/18: 18/24, 03/28/18: 19/24; 05/22/18: 20/30; 08/20/2018: 23/30; 10/02/18: 23/30; 10/31/18: 23/30; 12/05/18: 25/30; 12/26/18: 26/30     Time  12    Period  Weeks    Status  Achieved      PT LONG TERM GOAL #3   Title  Pt will improve ABC by at least 13%, which will also exceed falls cut-off of 67%, in order to demonstrate clinically significant improvement in balance confidence and decrease his risk for future falls.    Baseline  10/01/18: 64.375%; 10/31/18: 84.69%    Time  12    Period  Weeks    Status  Achieved      PT LONG TERM GOAL #4   Title  Patient will demonstrate floor to/from stand transfer without UE support and no external assistance in the possibly that he needs to perform tasks at floor level or if he needs to stand from the ground after a fall.    Baseline  08/20/2018: Requires L UE support on chair when rising from kneeling; 08/29/2018: required min A hand held assist when rising from tall kneeling; 10/01/18: Unable to transfer to/from floor without minA+1 assist from therapist as  well as LUE support on a surface such as a chair or railing. 11/06/18: Able to perform with rear knee (right) on Airex pad and BUE pressing through L knee, unable to perform from floor. 12/10/18: Able to perform with rear LE (left) on towel inconsistently requiring BUE through RLE to come to stand; 12/26/18: able to perform with L knee on towel inconsistently requiring BUE through RLE to come to stand, unable to  perform with R knee on towel without BUE support on // bars and mod/maxA from PT. ; 02/12/19: able to perform transfers bilaterally with CGA today, utilizing his knee on a towel for cushioning.     Time  12    Period  Weeks    Status  Achieved    Target Date  05/06/19      PT LONG TERM GOAL #5   Title  Pt will increase LEFS by at least 9 points in order to demonstrate significant improvement in lower extremity function especially related to running and cutting which will allow him to participate in basketball with his friends.    Baseline  10/01/18: 51/80; 10/31/18: 59/80; 12/10/18: 62/80    Time  12    Period  Weeks    Status  Achieved      PT LONG TERM GOAL #6   Title  Pt will improve Mini BESTest by at least 4 points in order to demonstrate clinically significant improvement in his balance especially in the domains of vertical head turns, backwards walking, and eyes closed walking to allow him to safely participate in sports, scan his environment when walking across campus, and decrease his risk for falls in low light environments.     Baseline  10/01/18: 21/28; 10/31/18: 24/28; 12/05/18: 24/28; 12/26/18: 25/28    Time  12    Period  Weeks    Status  Achieved      PT LONG TERM GOAL #7   Title  Patient will demonstrate prone to/from stand transfer without UE support and no external assistance in the possibly that he needs to perform tasks at floor level or if he needs to stand from the ground after a fall.    Baseline  02/11/19: Prone > quadruped > tall kneeling > 1/2 kneeling > stand attempted  today, requiring modA to transition from prone to quadruped, minA from quadruped to tall kneeling, and maxA to come to stand.      Time  12    Period  Weeks    Status  New    Target Date  05/06/19            Plan - 02/27/19 1736    Clinical Impression Statement  Pt continues to demonstrate excellent motivation throughout today's PT session.  He continues to demonstrate improved LE strength during reverse lunges. He also demonsrates improved coordination and speed on agility ladder today. He will continue to benefit from PT services to address deficits in strength, balance, and mobility in order to return to full function at home and school.    Personal Factors and Comorbidities  Time since onset of injury/illness/exacerbation    Examination-Activity Limitations  Carry;Lift;Squat;Stairs;Caring for Others;Reach Overhead;Bed Mobility;Bend;Transfers;Other   age-appropriate functional activities such as bed mobility, transfers, bending, lifting, carrying, lunging, running, squatting, walking, stairs, athletics, navigation across uneven ground,    Examination-Participation Restrictions  School;Community Activity;Driving;Other   athletics, community and social participation, and pet care   Stability/Clinical Decision Making  Evolving/Moderate complexity    Rehab Potential  Good    Clinical Impairments Affecting Rehab Potential  positive: good caregiver support, young in age, no co-morbidities; Negative: Chronicity, high fall risk;    PT Frequency  2x / week    PT Duration  12 weeks    PT Treatment/Interventions  Cryotherapy;Electrical Stimulation;Moist Heat;Gait training;Neuromuscular re-education;Balance training;Therapeutic exercise;Therapeutic activities;Functional mobility training;Stair training;Patient/family education;Orthotic Fit/Training;Energy conservation;Dry needling;Passive range of motion;Aquatic Therapy;ADLs/Self Care Home Management;DME Instruction;Manual techniques;Vestibular    cryotherapy/moist heat for  pt comfort for soreness, relaxation, & body temperature regulation PRN; estim for improved muscle activation, relaxation of tight muscles, or soreness relief PRN; dry needling for improved soreness, decreased muscle tension PRN   PT Next Visit Plan  Come to stand from prone, Ambulation with ball tossing, pivot turns, stepping over obstacles, jumping, bounding, lunging, floor to/from stand transfers, continue practicing with new AFO    PT Home Exercise Plan  New HEP provided on 02/13/19: [id number]    Consulted and Agree with Plan of Care  Patient       Patient will benefit from skilled therapeutic intervention in order to improve the following deficits and impairments:  Abnormal gait, Decreased cognition, Decreased mobility, Decreased coordination, Decreased activity tolerance, Decreased endurance, Decreased strength, Difficulty walking, Decreased safety awareness, Decreased balance, Impaired perceived functional ability, Impaired UE functional use, Decreased range of motion  Visit Diagnosis: Muscle weakness (generalized)  Unsteadiness on feet  Other lack of coordination     Problem List There are no active problems to display for this patient.  Phillips Grout PT, DPT, GCS  Huprich,Jason 02/27/2019, 5:42 PM  [place] MAIN [facility] SERVICES [address] [place], [place], [address] Phone: [phone]   Fax:  [phone]  Name: LEGRANDE HAO MRN: [id number] Date of Birth: [birth year]
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
## 2019-03-04 ENCOUNTER — Encounter: Payer: Self-pay | Admitting: Occupational Therapy

## 2019-03-04 ENCOUNTER — Other Ambulatory Visit: Payer: Self-pay

## 2019-03-04 ENCOUNTER — Ambulatory Visit: Payer: BC Managed Care – PPO

## 2019-03-04 ENCOUNTER — Ambulatory Visit: Payer: BC Managed Care – PPO | Admitting: Occupational Therapy

## 2019-03-04 DIAGNOSIS — M6281 Muscle weakness (generalized): Secondary | ICD-10-CM

## 2019-03-04 DIAGNOSIS — R2681 Unsteadiness on feet: Secondary | ICD-10-CM

## 2019-03-04 DIAGNOSIS — R278 Other lack of coordination: Secondary | ICD-10-CM
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
## 2019-03-06 ENCOUNTER — Ambulatory Visit: Payer: BC Managed Care – PPO | Admitting: Occupational Therapy

## 2019-03-06 ENCOUNTER — Encounter: Payer: Self-pay | Admitting: Occupational Therapy

## 2019-03-06 ENCOUNTER — Other Ambulatory Visit: Payer: Self-pay

## 2019-03-06 ENCOUNTER — Ambulatory Visit: Payer: BC Managed Care – PPO

## 2019-03-06 DIAGNOSIS — M6281 Muscle weakness (generalized): Secondary | ICD-10-CM | POA: Diagnosis not present

## 2019-03-06 DIAGNOSIS — R278 Other lack of coordination: Secondary | ICD-10-CM
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
## 2019-03-11 ENCOUNTER — Other Ambulatory Visit: Payer: Self-pay

## 2019-03-11 ENCOUNTER — Ambulatory Visit: Payer: BC Managed Care – PPO | Admitting: Occupational Therapy

## 2019-03-11 ENCOUNTER — Encounter: Payer: Self-pay | Admitting: Occupational Therapy

## 2019-03-11 ENCOUNTER — Ambulatory Visit: Payer: BC Managed Care – PPO

## 2019-03-11 DIAGNOSIS — R278 Other lack of coordination: Secondary | ICD-10-CM

## 2019-03-11 DIAGNOSIS — M6281 Muscle weakness (generalized): Secondary | ICD-10-CM | POA: Diagnosis not present

## 2019-03-11 DIAGNOSIS — R2681 Unsteadiness on feet: Secondary | ICD-10-CM

## 2019-03-11 NOTE — Therapy (Signed)
[place] [facility] SERVICES [address] [place], [place], [address] Phone: [phone]   Fax:  [phone]  Physical Therapy Treatment/Recertification  Patient Details  Name: Edgar [name] MRN: [id number] Date of Birth: 10/30/2000 No data recorded  Encounter Date: 03/11/2019  PT End of Session - 03/12/19 1003    Visit Number  [id number]    Number of Visits  410    Date for PT Re-Evaluation  06/03/19    Authorization Type  goals last updated on 02/11/19    Authorization Time Period  Medicaid authorization 1x/wk for 12 weeks (total of 12 visits): 02/27/19-05/20/18    Authorization - Visit Number  3    Authorization - Number of Visits  12    PT Start Time  1608    PT Stop Time  1646    PT Time Calculation (min)  38 min    Equipment Utilized During Treatment  Gait belt    Activity Tolerance  Patient tolerated treatment well;No increased pain    Behavior During Therapy  [facility] for tasks assessed/performed       History reviewed. No pertinent past medical history.  History reviewed. No pertinent surgical history.  There were no vitals filed for this visit.  Subjective Assessment - 03/11/19 1617    Subjective  Pt reports he is doing well today.  He is having some pain in his L posterior thigh today in the region of his hamstring. It started after carrying a heavy grocery bag for his mother after one of his PT session last week. He doesn't remember the exact date. No falls since last session. No specific questions at this time.    Pertinent History  personal factors affecting rehab: younger in age, time since initial injury, high fall risk, good caregiver support, going back to school so limited time available;     How long can you stand comfortably?  able to stand a while without getting tired;     How long can you walk comfortably?  2-3 laps around a small track;     Diagnostic tests  None recent;     Patient Stated Goals  be able to pick up his 60  pound boxer/pit bull mix dog, Buster; to be able to get up from the floor without pushing off of an object with his hands.     Currently in Pain?  Yes    Pain Score  4     Pain Location  Leg    Pain Orientation  Left;Posterior    Pain Descriptors / Indicators  Throbbing    Pain Type  Acute pain    Pain Onset  In the past 7 days    Pain Frequency  Intermittent           TREATMENT   Ther-ex  Octane X-RideL4 x5 min for warm-up (unbilled); Supine SLR x 15 bilateral; Sidelying SLR hip abduction x 15 bilateral; Hooklyig clams with manual resistance x 15; Hooklying adductor squeeze with manual resistance x 15; Prone on elbow pressing hips backward into partial quadruped on elbows position as transitional training for floor to stand transfers 2 x 10, feet off end of the table with therapist supporting legs to prevent pt from sliding;   Manual Therapy  L hamstring stretch 30s hold x 2; L SKTC stretch 30s x 2; L hip FADIR stretch 30s x 2; L hip FABER stretch 30s x 2;   Pt educated throughout session about proper posture and technique  with exercises. Improved exercise technique, movement at target joints, use of target muscles after min to mod verbal, visual, tactile cues   Ptcontinues to demonstrate excellent motivation throughout his PT sessions.Session was limited today due to reports of posterior L leg pain which is improving since lifting a bag of heavy groceries last week. Focused on supine exercises and outcome measures had to be deferred. His outcome measures were deferred but when last measured he had improved his 6MWT from 1370 ft to 1385 ft with CGA throughout, still falling significantly below the his age and gender predicted norm of 2917f. He continues to demonstrate improved strength with standing from floor in half-kneeling position, with the ability to come to stand from a 1/2 kneeling position bilaterally when in the optimal starting position.  With the  ability to come to stand from 1/2 kneeling, he was also assessed on his ability to come to stand from a prone position by transitioning from prone > quadruped > tall kneeling > 1/2 kneeling > stand in order to be able to demonstrate his ability to be able to independently get up from the floor in the event of a fall.  With this transfer, he required modA to transition from prone to quadruped, minA from quadruped to tall kneeling, and maxA to come to stand due to fatigue following other transitions. This indicates that if he were to fall, he would not be able to independently get up from the floor/ground without assistance. His HEP will be modified and reassessed next visit. He will continue to benefit from PT services to address deficits in strength, balance, and mobility in order to return to full function at home and school.                            PT Long Term Goals - 03/12/19 1017      PT LONG TERM GOAL #1   Title  Patient will improve 6 min walk test to >2000 feet to [facility] to [facility] between classes as well as play basketball with his friends (2948' is distance predicted by 6MWT reference equation based on age, gender, height, and weight)       Baseline  10/05/17: 950 feet on level surface; 01/31/18: 1100 feet on level surface, 03/28/18: 1250 feet; 05/21/18: 1170'; 06/11/18: 1300'; 08/22/2018: 1200 feet R AFO, no AD; 10/01/18: 1225' no AFO, no AD; 10/31/18: 1335' AFO, no AD, cloth facemask, CGA; 12/05/18: 1370' AFO, no AD, cloth facemask, CGA; 12/26/18: 1370' AFO, no AD, cloth facemask, CGA; 02/11/19: 1385' without AFO, wearing a cloth facemask, CGA    Time  12    Period  Weeks    Status  Partially Met    Target Date  05/21/19      PT LONG TERM GOAL #2   Title  Patient will improve FGA score by at least 6 points to indicate improved postural control for better balance specifically with  head turns during ambulation and backwards walking. These skills are critical for patient to be able to safely scan his environment during ambulation as well as safely participate in recreational activities such as basketball which would require him to regularly perform dynamic head turns and backwards stepping.     Baseline  01/31/18: 18/24, 03/28/18: 19/24; 05/22/18: 20/30; 08/20/2018: 23/30; 10/02/18: 23/30; 10/31/18: 23/30; 12/05/18: 25/30; 12/26/18: 26/30     Time  12  Period  Weeks    Status  Achieved      PT LONG TERM GOAL #3   Title  Pt will improve ABC by at least 13%, which will also exceed falls cut-off of 67%, in order to demonstrate clinically significant improvement in balance confidence and decrease his risk for future falls.    Baseline  10/01/18: 64.375%; 10/31/18: 84.69%    Time  12    Period  Weeks    Status  Achieved      PT LONG TERM GOAL #4   Title  Patient will demonstrate floor to/from stand transfer without UE support and no external assistance in the possibly that he needs to perform tasks at floor level or if he needs to stand from the ground after a fall.    Baseline  08/20/2018: Requires L UE support on chair when rising from kneeling; 08/29/2018: required min A hand held assist when rising from tall kneeling; 10/01/18: Unable to transfer to/from floor without minA+1 assist from therapist as well as LUE support on a surface such as a chair or railing. 11/06/18: Able to perform with rear knee (right) on Airex pad and BUE pressing through L knee, unable to perform from floor. 12/10/18: Able to perform with rear LE (left) on towel inconsistently requiring BUE through RLE to come to stand; 12/26/18: able to perform with L knee on towel inconsistently requiring BUE through RLE to come to stand, unable to perform with R knee on towel without BUE support on // bars and mod/maxA from PT. ; 02/12/19: able to perform transfers bilaterally with CGA today, utilizing his knee on a towel for cushioning.      Time  12    Period  Weeks    Status  Achieved      PT LONG TERM GOAL #5   Title  Pt will increase LEFS by at least 9 points in order to demonstrate significant improvement in lower extremity function especially related to running and cutting which will allow him to participate in basketball with his friends.    Baseline  10/01/18: 51/80; 10/31/18: 59/80; 12/10/18: 62/80    Time  12    Period  Weeks    Status  Achieved      PT LONG TERM GOAL #6   Title  Pt will improve Mini BESTest by at least 4 points in order to demonstrate clinically significant improvement in his balance especially in the domains of vertical head turns, backwards walking, and eyes closed walking to allow him to safely participate in sports, scan his environment when walking across campus, and decrease his risk for falls in low light environments.     Baseline  10/01/18: 21/28; 10/31/18: 24/28; 12/05/18: 24/28; 12/26/18: 25/28    Time  12    Period  Weeks    Status  Achieved      PT LONG TERM GOAL #7   Title  Patient will demonstrate prone to/from stand transfer without UE support and no external assistance in the possibly that he needs to perform tasks at floor level or if he needs to stand from the ground after a fall.    Baseline  02/11/19: Prone > quadruped > tall kneeling > 1/2 kneeling > stand attempted today, requiring modA to transition from prone to quadruped, minA from quadruped to tall kneeling, and maxA to come to stand.      Time  12    Period  Weeks    Status  On-going  Target Date  05/21/19            Plan - 03/12/19 1004    Clinical Impression Statement  Pt continues to demonstrate excellent motivation throughout his PT sessions.  Session was limited today due to reports of posterior L leg pain which is improving since lifting a bag of heavy groceries last week. Focused on supine exercises and outcome measures had to be deferred. His outcome measures were deferred but when last measured he had  improved his 6MWT from 1370 ft to 1385 ft with CGA throughout, still falling significantly below the his age and gender predicted norm of 2931f.  He continues to demonstrate improved strength with standing from floor in half-kneeling position, with the ability to come to stand from a 1/2 kneeling position bilaterally when in the optimal starting position.  With the ability to come to stand from 1/2 kneeling, he was also assessed on his ability to come to stand from a prone position by transitioning from prone > quadruped > tall kneeling > 1/2 kneeling > stand in order to be able to demonstrate his ability to be able to independently get up from the floor in the event of a fall.  With this transfer, he required modA to transition from prone to quadruped, minA from quadruped to tall kneeling, and maxA to come to stand due to fatigue following other transitions. This indicates that if he were to fall, he would not be able to independently get up from the floor/ground without assistance.  His HEP will be modified and reassessed next visit.  He will continue to benefit from PT services to address deficits in strength, balance, and mobility in order to return to full function at home and school.    Personal Factors and Comorbidities  Time since onset of injury/illness/exacerbation    Examination-Activity Limitations  Carry;Lift;Squat;Stairs;Caring for Others;Reach Overhead;Bed Mobility;Bend;Transfers;Other   age-appropriate functional activities such as bed mobility, transfers, bending, lifting, carrying, lunging, running, squatting, walking, stairs, athletics, navigation across uneven ground,    Examination-Participation Restrictions  School;Community Activity;Driving;Other   athletics, community and social participation, and pet care   Stability/Clinical Decision Making  Evolving/Moderate complexity    Rehab Potential  Good    Clinical Impairments Affecting Rehab Potential  positive: good caregiver support,  young in age, no co-morbidities; Negative: Chronicity, high fall risk;    PT Frequency  2x / week    PT Duration  12 weeks    PT Treatment/Interventions  Cryotherapy;Electrical Stimulation;Moist Heat;Gait training;Neuromuscular re-education;Balance training;Therapeutic exercise;Therapeutic activities;Functional mobility training;Stair training;Patient/family education;Orthotic Fit/Training;Energy conservation;Dry needling;Passive range of motion;Aquatic Therapy;ADLs/Self Care Home Management;DME Instruction;Manual techniques;Vestibular   cryotherapy/moist heat for pt comfort for soreness, relaxation, & body temperature regulation PRN; estim for improved muscle activation, relaxation of tight muscles, or soreness relief PRN; dry needling for improved soreness, decreased muscle tension PRN   PT Next Visit Plan  Modify HEP; Come to stand from prone, Ambulation with ball tossing, pivot turns, stepping over obstacles, jumping, bounding, lunging, floor to/from stand transfers, continue practicing with new AFO    PT Home Exercise Plan  New HEP provided on 02/13/19: [id number]    Consulted and Agree with Plan of Care  Patient       Patient will benefit from skilled therapeutic intervention in order to improve the following deficits and impairments:  Abnormal gait, Decreased cognition, Decreased mobility, Decreased coordination, Decreased activity tolerance, Decreased endurance, Decreased strength, Difficulty walking, Decreased safety awareness, Decreased balance, Impaired perceived functional ability, Impaired UE functional use,  Decreased range of motion  Visit Diagnosis: Muscle weakness (generalized)  Other lack of coordination  Unsteadiness on feet     Problem List There are no active problems to display for this patient.  Phillips Grout PT, DPT, GCS  Huprich,Jason 03/12/2019, 10:19 AM  [place] [facility] SERVICES [address] [place], [place],  [address] Phone: [phone]   Fax:  [phone]  Name: RILLEY STASH MRN: [id number] Date of Birth: 03/05/2001
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
## 2019-03-13 ENCOUNTER — Other Ambulatory Visit: Payer: Self-pay

## 2019-03-13 ENCOUNTER — Ambulatory Visit: Payer: BC Managed Care – PPO

## 2019-03-13 ENCOUNTER — Ambulatory Visit: Payer: BC Managed Care – PPO | Admitting: Occupational Therapy

## 2019-03-13 DIAGNOSIS — R278 Other lack of coordination: Secondary | ICD-10-CM

## 2019-03-13 DIAGNOSIS — M6281 Muscle weakness (generalized): Secondary | ICD-10-CM | POA: Diagnosis not present

## 2019-03-15 ENCOUNTER — Encounter: Payer: Self-pay | Admitting: Occupational Therapy
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
## 2019-03-18 ENCOUNTER — Ambulatory Visit: Payer: BC Managed Care – PPO

## 2019-03-18 ENCOUNTER — Ambulatory Visit: Payer: BC Managed Care – PPO | Admitting: Occupational Therapy

## 2019-03-18 ENCOUNTER — Other Ambulatory Visit: Payer: Self-pay

## 2019-03-18 ENCOUNTER — Encounter: Payer: Self-pay | Admitting: Occupational Therapy

## 2019-03-18 DIAGNOSIS — R2681 Unsteadiness on feet: Secondary | ICD-10-CM

## 2019-03-18 DIAGNOSIS — M6281 Muscle weakness (generalized): Secondary | ICD-10-CM

## 2019-03-18 DIAGNOSIS — R278 Other lack of coordination: Secondary | ICD-10-CM

## 2019-03-18 NOTE — Therapy (Signed)
[place] [facility] SERVICES [address] [place], [place], [address] Phone: [phone]   Fax:  [phone]  Physical Therapy Treatment  Patient Details  Name: Edgar [name] MRN: [id number] Date of Birth: [birth year] No data recorded  Encounter Date: 03/18/2019  PT End of Session - 03/18/19 1907    Visit Number  [id number]    Number of Visits  410    Date for PT Re-Evaluation  06/03/19    Authorization Type  goals last updated on 02/11/19    Authorization Time Period  Medicaid authorization 1x/wk for 12 weeks (total of 12 visits): 02/27/19-05/20/18    Authorization - Visit Number  4    Authorization - Number of Visits  12    PT Start Time  7588    PT Stop Time  3254    PT Time Calculation (min)  38 min    Equipment Utilized During Treatment  Gait belt    Activity Tolerance  Patient tolerated treatment well;No increased pain    Behavior During Therapy  [facility] for tasks assessed/performed       History reviewed. No pertinent past medical history.  History reviewed. No pertinent surgical history.  There were no vitals filed for this visit.  Subjective Assessment - 03/18/19 1612    Subjective  Patient reports no pain in L hamstring this session. Is feeling better after his previous session. No falls or LOB since last session.    Patient is accompained by:  --   neighbor   Pertinent History  personal factors affecting rehab: younger in age, time since initial injury, high fall risk, good caregiver support, going back to school so limited time available;     How long can you sit comfortably?  NA    How long can you stand comfortably?  able to stand a while without getting tired;     How long can you walk comfortably?  2-3 laps around a small track;     Diagnostic tests  None recent;     Patient Stated Goals  be able to pick up his 60 pound boxer/pit bull mix dog, Buster; to be able to get up from the floor without pushing off of an object with his  hands.     Currently in Pain?  No/denies           TREATMENT      Octane X-Ride L5 x 3 min  Speed ladder:  -two feet in ; 2x length of // bars, good velocity with stability on toe's  -lateral two feet in, more challenging than forward with decreased foot clearance, god speed  -diagonal two feet in/out; challenging to perform sudden changes in momentum , 4x length  -in in out out laterally, very challenging for patient but improved in coordination with repetition   Single leg layup jump 10x each LE, very challenging at this time, improved with repetition,   Lateral squat shuffle between 6 cones x 8 trials, good ability to maintain squat position   Squat cross body punch and return to squat position x3 minutes  Cross body lunge with elbow to sparring pad x 2 minutes       Pt educated throughout session about proper posture and technique with exercises. Improved exercise technique, movement at target joints, use of target muscles after min to mod verbal, visual, tactile cues    Patient presents with excellent motivation. He is challenged with single limb dynamic power movements fatiguing quickly at this  time. He continues to demonstrate improved coordination with repetition and is progressing well with dynamic stability. He will continue to benefit from PT services to address deficits in strength, balance, and mobility in order to return to full function at home and school.                    PT Education - 03/18/19 1906    Education provided  Yes    Education Details  exercise technique, body mechanics    Person(s) Educated  Patient    Methods  Explanation;Tactile cues;Verbal cues;Demonstration    Comprehension  Verbalized understanding;Returned demonstration;Verbal cues required;Tactile cues required          PT Long Term Goals - 03/12/19 1017      PT LONG TERM GOAL #1   Title  Patient will improve 6 min walk test to >2000 feet to allow him to fully  navigate the [facility] within appropriate time frames to transition between classes as well as play basketball with his friends (2948' is distance predicted by 6MWT reference equation based on age, gender, height, and weight)       Baseline  10/05/17: 950 feet on level surface; 01/31/18: 1100 feet on level surface, 03/28/18: 1250 feet; 05/21/18: 1170'; 06/11/18: 1300'; 08/22/2018: 1200 feet R AFO, no AD; 10/01/18: 1225' no AFO, no AD; 10/31/18: 1335' AFO, no AD, cloth facemask, CGA; 12/05/18: 1370' AFO, no AD, cloth facemask, CGA; 12/26/18: 1370' AFO, no AD, cloth facemask, CGA; 02/11/19: 1385' without AFO, wearing a cloth facemask, CGA    Time  12    Period  Weeks    Status  Partially Met    Target Date  05/21/19      PT LONG TERM GOAL #2   Title  Patient will improve FGA score by at least 6 points to indicate improved postural control for better balance specifically with head turns during ambulation and backwards walking. These skills are critical for patient to be able to safely scan his environment during ambulation as well as safely participate in recreational activities such as basketball which would require him to regularly perform dynamic head turns and backwards stepping.     Baseline  01/31/18: 18/24, 03/28/18: 19/24; 05/22/18: 20/30; 08/20/2018: 23/30; 10/02/18: 23/30; 10/31/18: 23/30; 12/05/18: 25/30; 12/26/18: 26/30     Time  12    Period  Weeks    Status  Achieved      PT LONG TERM GOAL #3   Title  Pt will improve ABC by at least 13%, which will also exceed falls cut-off of 67%, in order to demonstrate clinically significant improvement in balance confidence and decrease his risk for future falls.    Baseline  10/01/18: 64.375%; 10/31/18: 84.69%    Time  12    Period  Weeks    Status  Achieved      PT LONG TERM GOAL #4   Title  Patient will demonstrate floor to/from stand transfer without UE support and no external assistance in the possibly that he needs to perform tasks at  floor level or if he needs to stand from the ground after a fall.    Baseline  08/20/2018: Requires L UE support on chair when rising from kneeling; 08/29/2018: required min A hand held assist when rising from tall kneeling; 10/01/18: Unable to transfer to/from floor without minA+1 assist from therapist as well as LUE support on a surface such as a chair or railing. 11/06/18: Able to perform with  rear knee (right) on Airex pad and BUE pressing through L knee, unable to perform from floor. 12/10/18: Able to perform with rear LE (left) on towel inconsistently requiring BUE through RLE to come to stand; 12/26/18: able to perform with L knee on towel inconsistently requiring BUE through RLE to come to stand, unable to perform with R knee on towel without BUE support on // bars and mod/maxA from PT. ; 02/12/19: able to perform transfers bilaterally with CGA today, utilizing his knee on a towel for cushioning.     Time  12    Period  Weeks    Status  Achieved      PT LONG TERM GOAL #5   Title  Pt will increase LEFS by at least 9 points in order to demonstrate significant improvement in lower extremity function especially related to running and cutting which will allow him to participate in basketball with his friends.    Baseline  10/01/18: 51/80; 10/31/18: 59/80; 12/10/18: 62/80    Time  12    Period  Weeks    Status  Achieved      PT LONG TERM GOAL #6   Title  Pt will improve Mini BESTest by at least 4 points in order to demonstrate clinically significant improvement in his balance especially in the domains of vertical head turns, backwards walking, and eyes closed walking to allow him to safely participate in sports, scan his environment when walking across campus, and decrease his risk for falls in low light environments.     Baseline  10/01/18: 21/28; 10/31/18: 24/28; 12/05/18: 24/28; 12/26/18: 25/28    Time  12    Period  Weeks    Status  Achieved      PT LONG TERM GOAL #7   Title  Patient will demonstrate  prone to/from stand transfer without UE support and no external assistance in the possibly that he needs to perform tasks at floor level or if he needs to stand from the ground after a fall.    Baseline  02/11/19: Prone > quadruped > tall kneeling > 1/2 kneeling > stand attempted today, requiring modA to transition from prone to quadruped, minA from quadruped to tall kneeling, and maxA to come to stand.      Time  12    Period  Weeks    Status  On-going    Target Date  05/21/19            Plan - 03/18/19 1908    Clinical Impression Statement  Patient presents with excellent motivation. He is challenged with single limb dynamic power movements fatiguing quickly at this time. He continues to demonstrate improved coordination with repetition and is progressing well with dynamic stability. He will continue to benefit from PT services to address deficits in strength, balance, and mobility in order to return to full function at home and school.    Personal Factors and Comorbidities  Time since onset of injury/illness/exacerbation    Examination-Activity Limitations  Carry;Lift;Squat;Stairs;Caring for Others;Reach Overhead;Bed Mobility;Bend;Transfers;Other   age-appropriate functional activities such as bed mobility, transfers, bending, lifting, carrying, lunging, running, squatting, walking, stairs, athletics, navigation across uneven ground,    Examination-Participation Restrictions  School;Community Activity;Driving;Other   athletics, community and social participation, and pet care   Stability/Clinical Decision Making  Evolving/Moderate complexity    Rehab Potential  Good    Clinical Impairments Affecting Rehab Potential  positive: good caregiver support, young in age, no co-morbidities; Negative: Chronicity, high fall risk;  PT Frequency  2x / week    PT Duration  12 weeks    PT Treatment/Interventions  Cryotherapy;Electrical Stimulation;Moist Heat;Gait training;Neuromuscular  re-education;Balance training;Therapeutic exercise;Therapeutic activities;Functional mobility training;Stair training;Patient/family education;Orthotic Fit/Training;Energy conservation;Dry needling;Passive range of motion;Aquatic Therapy;ADLs/Self Care Home Management;DME Instruction;Manual techniques;Vestibular   cryotherapy/moist heat for pt comfort for soreness, relaxation, & body temperature regulation PRN; estim for improved muscle activation, relaxation of tight muscles, or soreness relief PRN; dry needling for improved soreness, decreased muscle tension PRN   PT Next Visit Plan  Modify HEP; Come to stand from prone, Ambulation with ball tossing, pivot turns, stepping over obstacles, jumping, bounding, lunging, floor to/from stand transfers, continue practicing with new AFO    PT Home Exercise Plan  New HEP provided on 02/13/19: [id number]    Consulted and Agree with Plan of Care  Patient       Patient will benefit from skilled therapeutic intervention in order to improve the following deficits and impairments:  Abnormal gait, Decreased cognition, Decreased mobility, Decreased coordination, Decreased activity tolerance, Decreased endurance, Decreased strength, Difficulty walking, Decreased safety awareness, Decreased balance, Impaired perceived functional ability, Impaired UE functional use, Decreased range of motion  Visit Diagnosis: Muscle weakness (generalized)  Other lack of coordination  Unsteadiness on feet     Problem List There are no active problems to display for this patient.  Janna Arch, PT, DPT   03/18/2019, 7:10 PM  [place] [facility]IN [facility] SERVICES [address] [place], [place], [address] Phone: [phone]   Fax:  [phone]  Name: SWAN ZAYED MRN: [id number] Date of Birth: [birth year]
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
## 2019-03-20 ENCOUNTER — Ambulatory Visit: Payer: BC Managed Care – PPO | Attending: Physical Medicine and Rehabilitation | Admitting: Occupational Therapy

## 2019-03-20 ENCOUNTER — Ambulatory Visit: Payer: BC Managed Care – PPO

## 2019-03-20 ENCOUNTER — Other Ambulatory Visit: Payer: Self-pay

## 2019-03-20 DIAGNOSIS — R2681 Unsteadiness on feet: Secondary | ICD-10-CM | POA: Insufficient documentation

## 2019-03-20 DIAGNOSIS — M6281 Muscle weakness (generalized): Secondary | ICD-10-CM | POA: Diagnosis not present

## 2019-03-20 DIAGNOSIS — R278 Other lack of coordination: Secondary | ICD-10-CM | POA: Diagnosis present

## 2019-03-21 ENCOUNTER — Encounter: Payer: Self-pay | Admitting: Occupational Therapy
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
## 2019-03-25 ENCOUNTER — Ambulatory Visit: Payer: BC Managed Care – PPO | Admitting: Occupational Therapy

## 2019-03-25 ENCOUNTER — Ambulatory Visit: Payer: BC Managed Care – PPO

## 2019-03-25 ENCOUNTER — Other Ambulatory Visit: Payer: Self-pay

## 2019-03-25 ENCOUNTER — Encounter: Payer: Self-pay | Admitting: Occupational Therapy

## 2019-03-25 DIAGNOSIS — R278 Other lack of coordination: Secondary | ICD-10-CM

## 2019-03-25 DIAGNOSIS — M6281 Muscle weakness (generalized): Secondary | ICD-10-CM

## 2019-03-25 DIAGNOSIS — R2681 Unsteadiness on feet: Secondary | ICD-10-CM
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
## 2019-03-25 NOTE — Therapy (Signed)
[place] MAIN [facility] SERVICES [address] [place], [place], [address] Phone: [phone]   Fax:  [phone]  Physical Therapy Treatment  Patient Details  Name: Edgar [name] MRN: [id number] Date of Birth: [birth year] No data recorded  Encounter Date: 03/25/2019  PT End of Session - 03/25/19 1613    Visit Number  [id number]    Number of Visits  410    Date for PT Re-Evaluation  06/03/19    Authorization Type  goals last updated on 02/11/19    Authorization Time Period  Medicaid authorization 1x/wk for 12 weeks (total of 12 visits): 02/27/19-05/20/18    Authorization - Visit Number  5    Authorization - Number of Visits  12    PT Start Time  1962    PT Stop Time  1645    PT Time Calculation (min)  35 min    Equipment Utilized During Treatment  Gait belt    Activity Tolerance  Patient tolerated treatment well    Behavior During Therapy  [facility] for tasks assessed/performed       History reviewed. No pertinent past medical history.  History reviewed. No pertinent surgical history.  There were no vitals filed for this visit.  Subjective Assessment - 03/25/19 1626    Subjective  Patient reports no pain in L hamstring this session. No other pain reported. No falls or LOB since last session. No specific questions or concerns currently.    Patient is accompained by:  --   neighbor   Pertinent History  personal factors affecting rehab: younger in age, time since initial injury, high fall risk, good caregiver support, going back to school so limited time available;     How long can you sit comfortably?  NA    How long can you stand comfortably?  able to stand a while without getting tired;     How long can you walk comfortably?  2-3 laps around a small track;     Diagnostic tests  None recent;     Patient Stated Goals  be able to pick up his 60 pound boxer/pit bull mix dog, Buster; to be able to get up from the floor without pushing off of an object with  his hands.     Currently in Pain?  No/denies          TREATMENT   Ther-ex  Quantum single leg press RLE: 165# 2 x 15, LLE: 165# x 20, 180# x 17; Lunges without UE support knee to floor x 5 on each side with UE support on leading knee but no external support other than CGA from therapist; Prone on elbow pressing hips backward into partial quadruped on elbows position as transitional training for floor to stand transfers x 10, feet off end of the table with therapist supporting legs to prevent pt from sliding; Tall kneeling side stepping 4' x 3 each direction; Bottom on heels transitioning to tall kneeling without UE support x 10; Quadruped with forward and backwards rocking to encourage increased WB through bilateral UE, followed by R/L weight shifting and therapist blocking R elbow to prevent buckling x multiple bouts; Broad jumps in gym without UE support challenging distance x multiple bouts with single and double combinations;   Pt educated throughout session about proper posture and technique with exercises. Improved exercise technique, movement at target joints, use of target muscles after min to mod verbal, visual, tactile cues   Ptcontinues todemonstrateexcellentmotivation throughouttoday's PT session. He is  able to continue with LE strengthening and progress to quadruped which is a position he was previously unable to full achieve. He is demonstrating improved core strength with prone and tall kneeling exercises. He will need updated outcome measures, goals, and a progress note at next visit. He will continue to benefit from PT services to address deficits in strength, balance, and mobility in order to return to full function at home and school.                          PT Long Term Goals - 03/12/19 1017      PT LONG TERM GOAL #1   Title  Patient will improve 6 min walk test to >2000 feet to allow him to fully navigate the [facility] within appropriate time frames to transition between classes as well as play basketball with his friends (2948' is distance predicted by 6MWT reference equation based on age, gender, height, and weight)       Baseline  10/05/17: 950 feet on level surface; 01/31/18: 1100 feet on level surface, 03/28/18: 1250 feet; 05/21/18: 1170'; 06/11/18: 1300'; 08/22/2018: 1200 feet R AFO, no AD; 10/01/18: 1225' no AFO, no AD; 10/31/18: 1335' AFO, no AD, cloth facemask, CGA; 12/05/18: 1370' AFO, no AD, cloth facemask, CGA; 12/26/18: 1370' AFO, no AD, cloth facemask, CGA; 02/11/19: 1385' without AFO, wearing a cloth facemask, CGA    Time  12    Period  Weeks    Status  Partially Met    Target Date  05/21/19      PT LONG TERM GOAL #2   Title  Patient will improve FGA score by at least 6 points to indicate improved postural control for better balance specifically with head turns during ambulation and backwards walking. These skills are critical for patient to be able to safely scan his environment during ambulation as well as safely participate in recreational activities such as basketball which would require him to regularly perform dynamic head turns and backwards stepping.     Baseline  01/31/18: 18/24, 03/28/18: 19/24; 05/22/18: 20/30; 08/20/2018: 23/30; 10/02/18: 23/30; 10/31/18: 23/30; 12/05/18: 25/30; 12/26/18: 26/30     Time  12    Period  Weeks    Status  Achieved      PT LONG TERM GOAL #3   Title  Pt will improve ABC by at least 13%, which will also exceed falls cut-off of 67%, in order to demonstrate clinically significant improvement in balance confidence and decrease his risk for future falls.    Baseline  10/01/18: 64.375%; 10/31/18: 84.69%    Time  12    Period  Weeks    Status  Achieved      PT LONG TERM GOAL #4   Title  Patient will demonstrate floor to/from stand transfer without UE support and no external assistance in the possibly that he needs to perform tasks at floor level or if he needs to stand  from the ground after a fall.    Baseline  08/20/2018: Requires L UE support on chair when rising from kneeling; 08/29/2018: required min A hand held assist when rising from tall kneeling; 10/01/18: Unable to transfer to/from floor without minA+1 assist from therapist as well as LUE support on a surface such as a chair or railing. 11/06/18: Able to perform with rear knee (right) on Airex pad and BUE pressing through L knee, unable to perform from floor. 12/10/18: Able to perform  with rear LE (left) on towel inconsistently requiring BUE through RLE to come to stand; 12/26/18: able to perform with L knee on towel inconsistently requiring BUE through RLE to come to stand, unable to perform with R knee on towel without BUE support on // bars and mod/maxA from PT. ; 02/12/19: able to perform transfers bilaterally with CGA today, utilizing his knee on a towel for cushioning.     Time  12    Period  Weeks    Status  Achieved      PT LONG TERM GOAL #5   Title  Pt will increase LEFS by at least 9 points in order to demonstrate significant improvement in lower extremity function especially related to running and cutting which will allow him to participate in basketball with his friends.    Baseline  10/01/18: 51/80; 10/31/18: 59/80; 12/10/18: 62/80    Time  12    Period  Weeks    Status  Achieved      PT LONG TERM GOAL #6   Title  Pt will improve Mini BESTest by at least 4 points in order to demonstrate clinically significant improvement in his balance especially in the domains of vertical head turns, backwards walking, and eyes closed walking to allow him to safely participate in sports, scan his environment when walking across campus, and decrease his risk for falls in low light environments.     Baseline  10/01/18: 21/28; 10/31/18: 24/28; 12/05/18: 24/28; 12/26/18: 25/28    Time  12    Period  Weeks    Status  Achieved      PT LONG TERM GOAL #7   Title  Patient will demonstrate prone to/from stand transfer without UE  support and no external assistance in the possibly that he needs to perform tasks at floor level or if he needs to stand from the ground after a fall.    Baseline  02/11/19: Prone > quadruped > tall kneeling > 1/2 kneeling > stand attempted today, requiring modA to transition from prone to quadruped, minA from quadruped to tall kneeling, and maxA to come to stand.      Time  12    Period  Weeks    Status  On-going    Target Date  05/21/19            Plan - 03/25/19 1614    Clinical Impression Statement  Pt continues to demonstrate excellent motivation throughout today's PT session. He is able to continue with LE strengthening and progress to quadruped which is a position he was previously unable to full achieve. He is demonstrating improved core strength with prone and tall kneeling exercises. He will need updated outcome measures, goals, and a progress note at next visit. He will continue to benefit from PT services to address deficits in strength, balance, and mobility in order to return to full function at home and school.    Personal Factors and Comorbidities  Time since onset of injury/illness/exacerbation    Examination-Activity Limitations  Carry;Lift;Squat;Stairs;Caring for Others;Reach Overhead;Bed Mobility;Bend;Transfers;Other   age-appropriate functional activities such as bed mobility, transfers, bending, lifting, carrying, lunging, running, squatting, walking, stairs, athletics, navigation across uneven ground,    Examination-Participation Restrictions  School;Community Activity;Driving;Other   athletics, community and social participation, and pet care   Stability/Clinical Decision Making  Evolving/Moderate complexity    Rehab Potential  Good    Clinical Impairments Affecting Rehab Potential  positive: good caregiver support, young in age, no co-morbidities; Negative: Chronicity, high  fall risk;    PT Frequency  2x / week    PT Duration  12 weeks    PT Treatment/Interventions   Cryotherapy;Electrical Stimulation;Moist Heat;Gait training;Neuromuscular re-education;Balance training;Therapeutic exercise;Therapeutic activities;Functional mobility training;Stair training;Patient/family education;Orthotic Fit/Training;Energy conservation;Dry needling;Passive range of motion;Aquatic Therapy;ADLs/Self Care Home Management;DME Instruction;Manual techniques;Vestibular   cryotherapy/moist heat for pt comfort for soreness, relaxation, & body temperature regulation PRN; estim for improved muscle activation, relaxation of tight muscles, or soreness relief PRN; dry needling for improved soreness, decreased muscle tension PRN   PT Next Visit Plan  Modify HEP; Come to stand from prone, Ambulation with ball tossing, pivot turns, stepping over obstacles, jumping, bounding, lunging, floor to/from stand transfers, continue practicing with new AFO    PT Home Exercise Plan  New HEP provided on 02/13/19: [id number]    Consulted and Agree with Plan of Care  Patient       Patient will benefit from skilled therapeutic intervention in order to improve the following deficits and impairments:  Abnormal gait, Decreased cognition, Decreased mobility, Decreased coordination, Decreased activity tolerance, Decreased endurance, Decreased strength, Difficulty walking, Decreased safety awareness, Decreased balance, Impaired perceived functional ability, Impaired UE functional use, Decreased range of motion  Visit Diagnosis: Muscle weakness (generalized)  Other lack of coordination  Unsteadiness on feet     Problem List There are no active problems to display for this patient.  Phillips Grout PT, DPT, GCS  Huprich,Jason 03/25/2019, 5:06 PM  [place] MAIN [facility] SERVICES [address] [place], [place], [address] Phone: [phone]   Fax:  [phone]  Name: Edgar [name] MRN: [id number] Date of Birth: [birth year]

## 2019-03-27 ENCOUNTER — Ambulatory Visit: Payer: BC Managed Care – PPO | Admitting: Occupational Therapy

## 2019-03-27 ENCOUNTER — Other Ambulatory Visit: Payer: Self-pay

## 2019-03-27 ENCOUNTER — Encounter: Payer: Self-pay | Admitting: Occupational Therapy

## 2019-03-27 ENCOUNTER — Ambulatory Visit: Payer: BC Managed Care – PPO

## 2019-03-27 DIAGNOSIS — M6281 Muscle weakness (generalized): Secondary | ICD-10-CM

## 2019-03-27 DIAGNOSIS — R278 Other lack of coordination: Secondary | ICD-10-CM
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
## 2019-04-01 ENCOUNTER — Ambulatory Visit: Payer: BC Managed Care – PPO

## 2019-04-01 ENCOUNTER — Encounter: Payer: Self-pay | Admitting: Occupational Therapy

## 2019-04-01 ENCOUNTER — Ambulatory Visit: Payer: BC Managed Care – PPO | Admitting: Occupational Therapy

## 2019-04-01 ENCOUNTER — Other Ambulatory Visit: Payer: Self-pay

## 2019-04-01 DIAGNOSIS — M6281 Muscle weakness (generalized): Secondary | ICD-10-CM

## 2019-04-01 DIAGNOSIS — R2681 Unsteadiness on feet: Secondary | ICD-10-CM

## 2019-04-01 DIAGNOSIS — R278 Other lack of coordination: Secondary | ICD-10-CM

## 2019-04-01 NOTE — Therapy (Signed)
[place] MAIN [facility] SERVICES [address] [place], [place], [address] Phone: [phone]   Fax:  [phone]  Physical Therapy Progress Note   Dates of reporting period  02/11/19   to   04/01/19  Patient Details  Name: Edgar [name] MRN: [id number] Date of Birth: 09-14-2000 No data recorded  Encounter Date: 04/01/2019  PT End of Session - 04/02/19 1616    Visit Number  300    Number of Visits  410    Date for PT Re-Evaluation  06/03/19    Authorization Type  goals last updated on 04/02/19    Authorization Time Period  Medicaid authorization 1x/wk for 12 weeks (total of 12 visits): 02/27/19-05/20/18    Authorization - Visit Number  6    Authorization - Number of Visits  12    PT Start Time  7616    PT Stop Time  0737    PT Time Calculation (min)  40 min    Equipment Utilized During Treatment  Gait belt    Activity Tolerance  Patient tolerated treatment well    Behavior During Therapy  [facility] for tasks assessed/performed       History reviewed. No pertinent past medical history.  History reviewed. No pertinent surgical history.  There were no vitals filed for this visit.  Subjective Assessment - 04/01/19 1621    Subjective  Patient reports that he is doing well today. No falls or LOB since last session. No specific questions or concerns currently.HEP going well and pt denies any pain currently.    Patient is accompained by:  --   neighbor   Pertinent History  personal factors affecting rehab: younger in age, time since initial injury, high fall risk, good caregiver support, going back to school so limited time available;     How long can you sit comfortably?  NA    How long can you stand comfortably?  able to stand a while without getting tired;     How long can you walk comfortably?  2-3 laps around a small track;     Diagnostic tests  None recent;     Patient Stated Goals  be able to pick up his 60 pound boxer/pit bull mix dog,  Buster; to be able to get up from the floor without pushing off of an object with his hands.     Currently in Pain?  No/denies         [facility] PT Assessment - 04/01/19 1626      6 Minute Walk- Baseline   6 Minute Walk- Baseline  yes    BP (mmHg)  138/75    HR (bpm)  100    02 Sat (%RA)  95 %    Modified Borg Scale for Dyspnea  0- Nothing at all    Perceived Rate of Exertion (Borg)  6-      6 Minute walk- Post Test   6 Minute Walk Post Test  yes    BP (mmHg)  153/79    HR (bpm)  170    02 Sat (%RA)  97 %    Modified Borg Scale for Dyspnea  9- Extremely severe    Perceived Rate of Exertion (Borg)  19-Very, very hard      6 minute walk test results    Aerobic Endurance Distance Walked  1410    Endurance additional comments  no AD; wearing a facemask throughout  TREATMENT   Ther-ex  xRide L6-7 x 5 minutes for warm-up during history (3 minutes unbilled); TRX reverse lunges x 10 bilateral; Trampoline sequence jumps in // bars without UE support x 2 minutes; Updated 6MWT with patient who ambulated 1410' without assistive device, no AFO, facemask; Performed floor to stand transfer with patient today. He is able to roll from stomach onto back and then transfer to long sitting. From longsitting he is able to roll onto knees and get into tall kneeling followed by half kneeling. He is able to perform all of this with CGA only. However once in tall kneeling he does not have adequate stability to get his feet in an optimal position to facilitate a transfer to standing. He especially has difficulty getting toes of rear foot underneath for support. Pt requires modA+1 assist to go from half kneeling to standing and falls over in the process due to loss of balance;   Pt educated throughout session about proper posture and technique with exercises. Improved exercise technique, movement at target joints, use of target muscles after min to mod verbal, visual, tactile  cues   Ptcontinues todemonstrateexcellentmotivation throughouthis PTsessions. Updated outcome measures with patient today that he has not yet achieved. He has improved his 6MWT from1385 ft to 1410 ft today which still fallssignificantly below the his age and gender predicted norm of 2945f. He continues to demonstrate improved strength with floor to stand transfers. From prone he is able to roll from stomach onto back and then transfer to long sitting. From longsitting he is able to roll onto knees and get into tall kneeling followed by half kneeling. He is able to perform all of this with CGA only. However once in tall kneeling he does not have adequate stability to get his feet in an optimal position to facilitate a transfer to standing. He especially has difficulty getting the toes of rear foot underneath for support. Pt requires modA+1 assist to go from half kneeling to standing and falls over in the process due to loss of balance to the left. He still demonstrates that if he were to fall he would not be able to independently get up from the floor/ground without assistance.He will continue to benefit from PT services to address deficits in strength, balance, and mobility in order to return to full function at home and school.                         PT Long Term Goals - 04/02/19 1623      PT LONG TERM GOAL #1   Title  Patient will improve 6 min walk test to >2000 feet to allow him to fully navigate the [facility] appropriate [facility] frames to transition between classes as well as play basketball with his friends (2948' is distance predicted by 6MWT reference equation based on age, gender, height, and weight)       Baseline  10/05/17: 950 feet on level surface; 01/31/18: 1100 feet on level surface, 03/28/18: 1250 feet; 05/21/18: 1170'; 06/11/18: 1300'; 08/22/2018: 1200 feet R AFO, no AD; 10/01/18: 1225' no AFO, no AD; 10/31/18: 1335' AFO, no AD,  cloth facemask, CGA; 12/05/18: 1370' AFO, no AD, cloth facemask, CGA; 12/26/18: 1370' AFO, no AD, cloth facemask, CGA; 02/11/19: 1385' without AFO, wearing a cloth facemask, CGA; 04/02/19: 1410' without assistive device with cloth facemask    Time  12    Period  Weeks    Status  Partially  Met    Target Date  05/21/19      PT LONG TERM GOAL #2   Title  Patient will improve FGA score by at least 6 points to indicate improved postural control for better balance specifically with head turns during ambulation and backwards walking. These skills are critical for patient to be able to safely scan his environment during ambulation as well as safely participate in recreational activities such as basketball which would require him to regularly perform dynamic head turns and backwards stepping.     Baseline  01/31/18: 18/24, 03/28/18: 19/24; 05/22/18: 20/30; 08/20/2018: 23/30; 10/02/18: 23/30; 10/31/18: 23/30; 12/05/18: 25/30; 12/26/18: 26/30     Time  12    Period  Weeks    Status  Achieved      PT LONG TERM GOAL #3   Title  Pt will improve ABC by at least 13%, which will also exceed falls cut-off of 67%, in order to demonstrate clinically significant improvement in balance confidence and decrease his risk for future falls.    Baseline  10/01/18: 64.375%; 10/31/18: 84.69%    Time  12    Period  Weeks    Status  Achieved      PT LONG TERM GOAL #4   Title  Patient will demonstrate floor to/from stand transfer without UE support and no external assistance in the possibly that he needs to perform tasks at floor level or if he needs to stand from the ground after a fall.    Baseline  08/20/2018: Requires L UE support on chair when rising from kneeling; 08/29/2018: required min A hand held assist when rising from tall kneeling; 10/01/18: Unable to transfer to/from floor without minA+1 assist from therapist as well as LUE support on a surface such as a chair or railing. 11/06/18: Able to perform with rear knee (right) on Airex  pad and BUE pressing through L knee, unable to perform from floor. 12/10/18: Able to perform with rear LE (left) on towel inconsistently requiring BUE through RLE to come to stand; 12/26/18: able to perform with L knee on towel inconsistently requiring BUE through RLE to come to stand, unable to perform with R knee on towel without BUE support on // bars and mod/maxA from PT. ; 02/12/19: able to perform transfers bilaterally with CGA today, utilizing his knee on a towel for cushioning.     Time  12    Period  Weeks    Status  Achieved      PT LONG TERM GOAL #5   Title  Pt will increase LEFS by at least 9 points in order to demonstrate significant improvement in lower extremity function especially related to running and cutting which will allow him to participate in basketball with his friends.    Baseline  10/01/18: 51/80; 10/31/18: 59/80; 12/10/18: 62/80    Time  12    Period  Weeks    Status  Achieved      Additional Long Term Goals   Additional Long Term Goals  Yes      PT LONG TERM GOAL #6   Title  Pt will improve Mini BESTest by at least 4 points in order to demonstrate clinically significant improvement in his balance especially in the domains of vertical head turns, backwards walking, and eyes closed walking to allow him to safely participate in sports, scan his environment when walking across campus, and decrease his risk for falls in low light environments.     Baseline  10/01/18:  21/28; 10/31/18: 24/28; 12/05/18: 24/28; 12/26/18: 25/28    Time  12    Period  Weeks    Status  Achieved      PT LONG TERM GOAL #7   Title  Patient will demonstrate prone to/from stand transfer without UE support and no external assistance in the possibly that he needs to perform tasks at floor level or if he needs to stand from the ground after a fall.    Baseline  02/11/19: Prone > quadruped > tall kneeling > 1/2 kneeling > stand attempted today, requiring modA to transition from prone to quadruped, minA from  quadruped to tall kneeling, and maxA to come to stand.; 04/01/19: Prone > quadruped > tall kneeling > 1/2 kneeling > stand attempted today, requiring CGA to transition from prone to half kneeling and modA to come to stand with LOB to the left.      Time  12    Period  Weeks    Status  Partially Met    Target Date  05/21/19            Plan - 04/02/19 1616    Clinical Impression Statement  Pt continues to demonstrate excellent motivation throughout his PT sessions. Updated outcome measures with patient today that he has not yet achieved. He has improved his 6MWT from 1385 ft to 1410 ft today which still falls significantly below the his age and gender predicted norm of 2991f.  He continues to demonstrate improved strength with floor to stand transfers. From prone he is able to roll from stomach onto back and then transfer to long sitting. From longsitting he is able to roll onto knees and get into tall kneeling followed by half kneeling. He is able to perform all of this with CGA only. However once in tall kneeling he does not have adequate stability to get his feet in an optimal position to facilitate a transfer to standing. He especially has difficulty getting the toes of rear foot underneath for support. Pt requires modA+1 assist to go from half kneeling to standing and falls over in the process due to loss of balance to the left. He still demonstrates that if he were to fall he would not be able to independently get up from the floor/ground without assistance.  He will continue to benefit from PT services to address deficits in strength, balance, and mobility in order to return to full function at home and school.    Personal Factors and Comorbidities  Time since onset of injury/illness/exacerbation    Examination-Activity Limitations  Carry;Lift;Squat;Stairs;Caring for Others;Reach Overhead;Bed Mobility;Bend;Transfers;Other   age-appropriate functional activities such as bed mobility, transfers,  bending, lifting, carrying, lunging, running, squatting, walking, stairs, athletics, navigation across uneven ground,    Examination-Participation Restrictions  School;Community Activity;Driving;Other   athletics, community and social participation, and pet care   Stability/Clinical Decision Making  Evolving/Moderate complexity    Rehab Potential  Good    Clinical Impairments Affecting Rehab Potential  positive: good caregiver support, young in age, no co-morbidities; Negative: Chronicity, high fall risk;    PT Frequency  2x / week    PT Duration  12 weeks    PT Treatment/Interventions  Cryotherapy;Electrical Stimulation;Moist Heat;Gait training;Neuromuscular re-education;Balance training;Therapeutic exercise;Therapeutic activities;Functional mobility training;Stair training;Patient/family education;Orthotic Fit/Training;Energy conservation;Dry needling;Passive range of motion;Aquatic Therapy;ADLs/Self Care Home Management;DME Instruction;Manual techniques;Vestibular   cryotherapy/moist heat for pt comfort for soreness, relaxation, & body temperature regulation PRN; estim for improved muscle activation, relaxation of tight muscles, or soreness relief PRN;  dry needling for improved soreness, decreased muscle tension PRN   PT Next Visit Plan  Modify HEP; Come to stand from prone, Ambulation with ball tossing, pivot turns, stepping over obstacles, jumping, bounding, lunging, floor to/from stand transfers, continue practicing with new AFO    PT Home Exercise Plan  New HEP provided on 02/13/19: [id number]    Consulted and Agree with Plan of Care  Patient       Patient will benefit from skilled therapeutic intervention in order to improve the following deficits and impairments:  Abnormal gait, Decreased cognition, Decreased mobility, Decreased coordination, Decreased activity tolerance, Decreased endurance, Decreased strength, Difficulty walking, Decreased safety awareness, Decreased balance, Impaired  perceived functional ability, Impaired UE functional use, Decreased range of motion  Visit Diagnosis: Muscle weakness (generalized)  Unsteadiness on feet     Problem List There are no problems to display for this patient.  Phillips Grout PT, DPT, GCS  Tameaka Eichhorn 04/02/2019, 4:32 PM  [place] MAIN [facility] SERVICES [address] [place], [place], [address] Phone: [phone]   Fax:  [phone]  Name: Edgar [name] MRN: [id number] Date of Birth: 10/01/2000
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
## 2019-04-03 ENCOUNTER — Ambulatory Visit: Payer: BC Managed Care – PPO

## 2019-04-03 ENCOUNTER — Ambulatory Visit: Payer: BC Managed Care – PPO | Admitting: Occupational Therapy

## 2019-04-08 ENCOUNTER — Ambulatory Visit: Payer: BC Managed Care – PPO

## 2019-04-08 ENCOUNTER — Ambulatory Visit: Payer: BC Managed Care – PPO | Admitting: Occupational Therapy

## 2019-04-08 ENCOUNTER — Other Ambulatory Visit: Payer: Self-pay

## 2019-04-08 ENCOUNTER — Encounter: Payer: Self-pay | Admitting: Occupational Therapy

## 2019-04-08 DIAGNOSIS — M6281 Muscle weakness (generalized): Secondary | ICD-10-CM

## 2019-04-08 DIAGNOSIS — R2681 Unsteadiness on feet: Secondary | ICD-10-CM

## 2019-04-08 DIAGNOSIS — R278 Other lack of coordination: Secondary | ICD-10-CM
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
## 2019-04-08 NOTE — Therapy (Signed)
[place] MAIN [facility] SERVICES [address] [place], [place], [address] Phone: [phone]   Fax:  [phone]  Physical Therapy Treatment  Patient Details  Name: Edgar [name] MRN: [id number] Date of Birth: [birth year] No data recorded  Encounter Date: 04/08/2019  PT End of Session - 04/08/19 1607    Visit Number  301    Number of Visits  410    Date for PT Re-Evaluation  06/03/19    Authorization Type  goals last updated on 04/02/19    Authorization Time Period  Medicaid authorization 1x/wk for 12 weeks (total of 12 visits): 02/27/19-05/20/18    Authorization - Visit Number  7    Authorization - Number of Visits  12    PT Start Time  3009    PT Stop Time  2330    PT Time Calculation (min)  40 min    Equipment Utilized During Treatment  Gait belt    Activity Tolerance  Patient tolerated treatment well    Behavior During Therapy  [facility] for tasks assessed/performed       History reviewed. No pertinent past medical history.  History reviewed. No pertinent surgical history.  There were no vitals filed for this visit.  Subjective Assessment - 04/08/19 1606    Subjective  Patient reports that he is doing well today. No falls or LOB since last session. No specific questions or concerns currently. Denies pain upon arrival.    Patient is accompained by:  --    Pertinent History  personal factors affecting rehab: younger in age, time since initial injury, high fall risk, good caregiver support, going back to school so limited time available;     How long can you sit comfortably?  NA    How long can you stand comfortably?  able to stand a while without getting tired;     How long can you walk comfortably?  2-3 laps around a small track;     Diagnostic tests  None recent;     Patient Stated Goals  be able to pick up his 60 pound boxer/pit bull mix dog, Buster; to be able to get up from the floor without pushing off of an object with his hands.      Currently in Pain?  No/denies         TREATMENT   Ther-ex  xRide L4/8 x 45s intervals for 4 minutes for warm-up during history (2 minutes unbilled); Attempted prone knee planks however pt reporting low back pain today after 2 reps so discontinued; Abdominal strength testing performed with pt unable to perform sit up in arms in any position. Able to clear inferior border of scapula with arms down beside legs; Hooklying oblique crunch with opposite shoulder to knee x 10 each side; Hooklying lumbar oblique rotations (rocking) x 10 each direction; Hooklying hip fallouts x 10 to each side; Hooklying bridges with 3s hold at top 2 x 10; Sit to stand jumps from blue mat table at lowest setting x 10; Attempted wall front and side planks but pt unable to keep shoulders elevated high enough to support himself; TRX rows x 15; TRX push-up x 10 with therapist keeping shoulders flexed to 90 degrees; Lunges without UE support knee to floor x5on each side with UE support on leading knee but no external support other than CGA from therapist; Broad jumps in // bars without UE supportchallenging distance x 6;   Pt educated throughout session about proper posture and technique  with exercises. Improved exercise technique, movement at target joints, use of target muscles after min to mod verbal, visual, tactile cues   Ptcontinues todemonstrateexcellentmotivation throughouttoday's PT session. He is able to continue with LE strengthening but also worked on abdominal strengthening today as he is very weak in his abdominals and low back. He is able to clear his inferior scapular borders from the table but is unable to perform a sit-up. Utilized TRX today for rows and push ups with some assist from therapist. He will continue to benefit from PT services to address deficits in strength, balance, and mobility in order to return to full function at home and  school.                           PT Long Term Goals - 04/02/19 1623      PT LONG TERM GOAL #1   Title  Patient will improve 6 min walk test to >2000 feet to allow him to fully navigate the [facility] within appropriate time frames to transition between classes as well as play basketball with his friends (2948' is distance predicted by 6MWT reference equation based on age, gender, height, and weight)       Baseline  10/05/17: 950 feet on level surface; 01/31/18: 1100 feet on level surface, 03/28/18: 1250 feet; 05/21/18: 1170'; 06/11/18: 1300'; 08/22/2018: 1200 feet R AFO, no AD; 10/01/18: 1225' no AFO, no AD; 10/31/18: 1335' AFO, no AD, cloth facemask, CGA; 12/05/18: 1370' AFO, no AD, cloth facemask, CGA; 12/26/18: 1370' AFO, no AD, cloth facemask, CGA; 02/11/19: 1385' without AFO, wearing a cloth facemask, CGA; 04/02/19: 1410' without assistive device with cloth facemask    Time  12    Period  Weeks    Status  Partially Met    Target Date  05/21/19      PT LONG TERM GOAL #2   Title  Patient will improve FGA score by at least 6 points to indicate improved postural control for better balance specifically with head turns during ambulation and backwards walking. These skills are critical for patient to be able to safely scan his environment during ambulation as well as safely participate in recreational activities such as basketball which would require him to regularly perform dynamic head turns and backwards stepping.     Baseline  01/31/18: 18/24, 03/28/18: 19/24; 05/22/18: 20/30; 08/20/2018: 23/30; 10/02/18: 23/30; 10/31/18: 23/30; 12/05/18: 25/30; 12/26/18: 26/30     Time  12    Period  Weeks    Status  Achieved      PT LONG TERM GOAL #3   Title  Pt will improve ABC by at least 13%, which will also exceed falls cut-off of 67%, in order to demonstrate clinically significant improvement in balance confidence and decrease his risk for future falls.    Baseline   10/01/18: 64.375%; 10/31/18: 84.69%    Time  12    Period  Weeks    Status  Achieved      PT LONG TERM GOAL #4   Title  Patient will demonstrate floor to/from stand transfer without UE support and no external assistance in the possibly that he needs to perform tasks at floor level or if he needs to stand from the ground after a fall.    Baseline  08/20/2018: Requires L UE support on chair when rising from kneeling; 08/29/2018: required min A hand held assist when rising from tall kneeling; 10/01/18: Unable to  transfer to/from floor without minA+1 assist from therapist as well as LUE support on a surface such as a chair or railing. 11/06/18: Able to perform with rear knee (right) on Airex pad and BUE pressing through L knee, unable to perform from floor. 12/10/18: Able to perform with rear LE (left) on towel inconsistently requiring BUE through RLE to come to stand; 12/26/18: able to perform with L knee on towel inconsistently requiring BUE through RLE to come to stand, unable to perform with R knee on towel without BUE support on // bars and mod/maxA from PT. ; 02/12/19: able to perform transfers bilaterally with CGA today, utilizing his knee on a towel for cushioning.     Time  12    Period  Weeks    Status  Achieved      PT LONG TERM GOAL #5   Title  Pt will increase LEFS by at least 9 points in order to demonstrate significant improvement in lower extremity function especially related to running and cutting which will allow him to participate in basketball with his friends.    Baseline  10/01/18: 51/80; 10/31/18: 59/80; 12/10/18: 62/80    Time  12    Period  Weeks    Status  Achieved      Additional Long Term Goals   Additional Long Term Goals  Yes      PT LONG TERM GOAL #6   Title  Pt will improve Mini BESTest by at least 4 points in order to demonstrate clinically significant improvement in his balance especially in the domains of vertical head turns, backwards walking, and eyes closed walking to  allow him to safely participate in sports, scan his environment when walking across campus, and decrease his risk for falls in low light environments.     Baseline  10/01/18: 21/28; 10/31/18: 24/28; 12/05/18: 24/28; 12/26/18: 25/28    Time  12    Period  Weeks    Status  Achieved      PT LONG TERM GOAL #7   Title  Patient will demonstrate prone to/from stand transfer without UE support and no external assistance in the possibly that he needs to perform tasks at floor level or if he needs to stand from the ground after a fall.    Baseline  02/11/19: Prone > quadruped > tall kneeling > 1/2 kneeling > stand attempted today, requiring modA to transition from prone to quadruped, minA from quadruped to tall kneeling, and maxA to come to stand.; 04/01/19: Prone > quadruped > tall kneeling > 1/2 kneeling > stand attempted today, requiring CGA to transition from prone to half kneeling and modA to come to stand with LOB to the left.      Time  12    Period  Weeks    Status  Partially Met    Target Date  05/21/19            Plan - 04/08/19 1608    Clinical Impression Statement  Pt continues to demonstrate excellent motivation throughout today's PT session. He is able to continue with LE strengthening but also worked on abdominal strengthening today as he is very weak in his abdominals and low back. He is able to clear his inferior scapular borders from the table but is unable to perform a sit-up. Utilized TRX today for rows and push ups with some assist from therapist. He will continue to benefit from PT services to address deficits in strength, balance, and mobility in order to  return to full function at home and school.    Personal Factors and Comorbidities  Time since onset of injury/illness/exacerbation    Examination-Activity Limitations  Carry;Lift;Squat;Stairs;Caring for Others;Reach Overhead;Bed Mobility;Bend;Transfers;Other   age-appropriate functional activities such as bed mobility, transfers,  bending, lifting, carrying, lunging, running, squatting, walking, stairs, athletics, navigation across uneven ground,    Examination-Participation Restrictions  School;Community Activity;Driving;Other   athletics, community and social participation, and pet care   Stability/Clinical Decision Making  Evolving/Moderate complexity    Rehab Potential  Good    Clinical Impairments Affecting Rehab Potential  positive: good caregiver support, young in age, no co-morbidities; Negative: Chronicity, high fall risk;    PT Frequency  2x / week    PT Duration  12 weeks    PT Treatment/Interventions  Cryotherapy;Electrical Stimulation;Moist Heat;Gait training;Neuromuscular re-education;Balance training;Therapeutic exercise;Therapeutic activities;Functional mobility training;Stair training;Patient/family education;Orthotic Fit/Training;Energy conservation;Dry needling;Passive range of motion;Aquatic Therapy;ADLs/Self Care Home Management;DME Instruction;Manual techniques;Vestibular   cryotherapy/moist heat for pt comfort for soreness, relaxation, & body temperature regulation PRN; estim for improved muscle activation, relaxation of tight muscles, or soreness relief PRN; dry needling for improved soreness, decreased muscle tension PRN   PT Next Visit Plan  Modify HEP; Come to stand from prone, Ambulation with ball tossing, pivot turns, stepping over obstacles, jumping, bounding, lunging, floor to/from stand transfers, continue practicing with new AFO    PT Home Exercise Plan  New HEP provided on 02/13/19: [id number]    Consulted and Agree with Plan of Care  Patient       Patient will benefit from skilled therapeutic intervention in order to improve the following deficits and impairments:  Abnormal gait, Decreased cognition, Decreased mobility, Decreased coordination, Decreased activity tolerance, Decreased endurance, Decreased strength, Difficulty walking, Decreased safety awareness, Decreased balance, Impaired  perceived functional ability, Impaired UE functional use, Decreased range of motion  Visit Diagnosis: Muscle weakness (generalized)  Unsteadiness on feet     Problem List There are no problems to display for this patient.  Phillips Grout PT, DPT, GCS  Benz Vandenberghe 04/09/2019, 3:34 PM  [place] [facility] SERVICES [address] [place], [place], [address] Phone: [phone]   Fax:  [phone]  Name: Edgar [name] MRN: [id number] Date of Birth: [birth year]

## 2019-04-10 ENCOUNTER — Other Ambulatory Visit: Payer: Self-pay

## 2019-04-10 ENCOUNTER — Encounter: Payer: Self-pay | Admitting: Occupational Therapy

## 2019-04-10 ENCOUNTER — Ambulatory Visit: Payer: BC Managed Care – PPO | Admitting: Occupational Therapy

## 2019-04-10 ENCOUNTER — Ambulatory Visit: Payer: BC Managed Care – PPO

## 2019-04-10 DIAGNOSIS — M6281 Muscle weakness (generalized): Secondary | ICD-10-CM | POA: Diagnosis not present

## 2019-04-10 DIAGNOSIS — R278 Other lack of coordination: Secondary | ICD-10-CM
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
## 2019-04-15 ENCOUNTER — Other Ambulatory Visit: Payer: Self-pay

## 2019-04-15 ENCOUNTER — Ambulatory Visit: Payer: BC Managed Care – PPO | Admitting: Occupational Therapy

## 2019-04-15 ENCOUNTER — Encounter: Payer: Self-pay | Admitting: Occupational Therapy

## 2019-04-15 ENCOUNTER — Ambulatory Visit: Payer: BC Managed Care – PPO

## 2019-04-15 DIAGNOSIS — M6281 Muscle weakness (generalized): Secondary | ICD-10-CM

## 2019-04-15 DIAGNOSIS — R278 Other lack of coordination: Secondary | ICD-10-CM

## 2019-04-15 DIAGNOSIS — R2681 Unsteadiness on feet: Secondary | ICD-10-CM

## 2019-04-15 NOTE — Therapy (Signed)
[place] MAIN [facility] SERVICES [address] [place], [place], [address] Phone: [phone]   Fax:  [phone]  Physical Therapy Treatment  Patient Details  Name: Edgar [name] MRN: [id number] Date of Birth: 11/19/2000 No data recorded  Encounter Date: 04/15/2019  PT End of Session - 04/15/19 1612    Visit Number  302    Number of Visits  410    Date for PT Re-Evaluation  06/03/19    Authorization Type  goals last updated on 04/02/19    Authorization Time Period  Medicaid authorization 1x/wk for 12 weeks (total of 12 visits): 02/27/19-05/20/18    Authorization - Visit Number  8    Authorization - Number of Visits  12    PT Start Time  8242    PT Stop Time  1645    PT Time Calculation (min)  35 min    Equipment Utilized During Treatment  Gait belt    Activity Tolerance  Patient tolerated treatment well    Behavior During Therapy  [facility] for tasks assessed/performed       History reviewed. No pertinent past medical history.  History reviewed. No pertinent surgical history.  There were no vitals filed for this visit.  Subjective Assessment - 04/15/19 1611    Subjective  Patient reports that he is doing well today. No falls or LOB since last session. No specific questions or concerns currently. Denies pain upon arrival.    Pertinent History  personal factors affecting rehab: younger in age, time since initial injury, high fall risk, good caregiver support, going back to school so limited time available;     How long can you sit comfortably?  NA    How long can you stand comfortably?  able to stand a while without getting tired;     How long can you walk comfortably?  2-3 laps around a small track;     Diagnostic tests  None recent;     Patient Stated Goals  be able to pick up his 60 pound boxer/pit bull mix dog, Buster; to be able to get up from the floor without pushing off of an object with his hands.     Currently in Pain?  No/denies          TREATMENT   Ther-ex xRide L5/10 x 45s intervals for 5 minutes for warm-up during history (3 minutes unbilled); TRX rows x 15; TRX push-up x 15 with therapist keeping shoulders flexed to 90 degrees; Half kneeling diagonals with 2# med ball going low to high x 10 bilateral; Hooklying ab curl up attempting to clear inferior angle of scapulae off table, arms on thighs with therapist blocking feet x 10; Hooklying oblique crunch with opposite shoulder/arm to knee x 10 each side; Hooklying crunch with BUE assist to come up farther with slow eccentric return to supine x 5; Hooklying isometric resisted lumbar rotations 5s x 10 each direction;   Pt educated throughout session about proper posture and technique with exercises. Improved exercise technique, movement at target joints, use of target muscles after min to mod verbal, visual, tactile cues   Ptarrived late so session was abbreviated accordingly. He continues todemonstrateexcellentmotivation throughouttoday's PT session.He is able to continue with LE strengthening but also worked on abdominal strengthening today as he is very weak in his abdominals and low back. Which limits his stability in half kneeling and during transitional positions with floor to stand transfers. Utilized TRX again today for rows and push  ups with some assist from therapist. He will continue to benefit from PT services to address deficits in strength, balance, and mobility in order to return to full function at home and school.                         PT Long Term Goals - 04/02/19 1623      PT LONG TERM GOAL #1   Title  Patient will improve 6 min walk test to >2000 feet to allow him to fully navigate the [facility] within appropriate time frames to transition between classes as well as play basketball with his friends (2948' is distance predicted by 6MWT reference equation based on age, gender, height, and  weight)       Baseline  10/05/17: 950 feet on level surface; 01/31/18: 1100 feet on level surface, 03/28/18: 1250 feet; 05/21/18: 1170'; 06/11/18: 1300'; 08/22/2018: 1200 feet R AFO, no AD; 10/01/18: 1225' no AFO, no AD; 10/31/18: 1335' AFO, no AD, cloth facemask, CGA; 12/05/18: 1370' AFO, no AD, cloth facemask, CGA; 12/26/18: 1370' AFO, no AD, cloth facemask, CGA; 02/11/19: 1385' without AFO, wearing a cloth facemask, CGA; 04/02/19: 1410' without assistive device with cloth facemask    Time  12    Period  Weeks    Status  Partially Met    Target Date  05/21/19      PT LONG TERM GOAL #2   Title  Patient will improve FGA score by at least 6 points to indicate improved postural control for better balance specifically with head turns during ambulation and backwards walking. These skills are critical for patient to be able to safely scan his environment during ambulation as well as safely participate in recreational activities such as basketball which would require him to regularly perform dynamic head turns and backwards stepping.     Baseline  01/31/18: 18/24, 03/28/18: 19/24; 05/22/18: 20/30; 08/20/2018: 23/30; 10/02/18: 23/30; 10/31/18: 23/30; 12/05/18: 25/30; 12/26/18: 26/30     Time  12    Period  Weeks    Status  Achieved      PT LONG TERM GOAL #3   Title  Pt will improve ABC by at least 13%, which will also exceed falls cut-off of 67%, in order to demonstrate clinically significant improvement in balance confidence and decrease his risk for future falls.    Baseline  10/01/18: 64.375%; 10/31/18: 84.69%    Time  12    Period  Weeks    Status  Achieved      PT LONG TERM GOAL #4   Title  Patient will demonstrate floor to/from stand transfer without UE support and no external assistance in the possibly that he needs to perform tasks at floor level or if he needs to stand from the ground after a fall.    Baseline  08/20/2018: Requires L UE support on chair when rising from kneeling; 08/29/2018: required min A hand held  assist when rising from tall kneeling; 10/01/18: Unable to transfer to/from floor without minA+1 assist from therapist as well as LUE support on a surface such as a chair or railing. 11/06/18: Able to perform with rear knee (right) on Airex pad and BUE pressing through L knee, unable to perform from floor. 12/10/18: Able to perform with rear LE (left) on towel inconsistently requiring BUE through RLE to come to stand; 12/26/18: able to perform with L knee on towel inconsistently requiring BUE through RLE to come to stand, unable  to perform with R knee on towel without BUE support on // bars and mod/maxA from PT. ; 02/12/19: able to perform transfers bilaterally with CGA today, utilizing his knee on a towel for cushioning.     Time  12    Period  Weeks    Status  Achieved      PT LONG TERM GOAL #5   Title  Pt will increase LEFS by at least 9 points in order to demonstrate significant improvement in lower extremity function especially related to running and cutting which will allow him to participate in basketball with his friends.    Baseline  10/01/18: 51/80; 10/31/18: 59/80; 12/10/18: 62/80    Time  12    Period  Weeks    Status  Achieved      Additional Long Term Goals   Additional Long Term Goals  Yes      PT LONG TERM GOAL #6   Title  Pt will improve Mini BESTest by at least 4 points in order to demonstrate clinically significant improvement in his balance especially in the domains of vertical head turns, backwards walking, and eyes closed walking to allow him to safely participate in sports, scan his environment when walking across campus, and decrease his risk for falls in low light environments.     Baseline  10/01/18: 21/28; 10/31/18: 24/28; 12/05/18: 24/28; 12/26/18: 25/28    Time  12    Period  Weeks    Status  Achieved      PT LONG TERM GOAL #7   Title  Patient will demonstrate prone to/from stand transfer without UE support and no external assistance in the possibly that he needs to perform  tasks at floor level or if he needs to stand from the ground after a fall.    Baseline  02/11/19: Prone > quadruped > tall kneeling > 1/2 kneeling > stand attempted today, requiring modA to transition from prone to quadruped, minA from quadruped to tall kneeling, and maxA to come to stand.; 04/01/19: Prone > quadruped > tall kneeling > 1/2 kneeling > stand attempted today, requiring CGA to transition from prone to half kneeling and modA to come to stand with LOB to the left.      Time  12    Period  Weeks    Status  Partially Met    Target Date  05/21/19            Plan - 04/15/19 1613    Clinical Impression Statement  Pt arrived late so session was abbreviated accordingly. He continues to demonstrate excellent motivation throughout today's PT session. He is able to continue with LE strengthening but also worked on abdominal strengthening today as he is very weak in his abdominals and low back. Which limits his stability in half kneeling and during transitional positions with floor to stand transfers. Utilized TRX again today for rows and push ups with some assist from therapist. He will continue to benefit from PT services to address deficits in strength, balance, and mobility in order to return to full function at home and school.    Personal Factors and Comorbidities  Time since onset of injury/illness/exacerbation    Examination-Activity Limitations  Carry;Lift;Squat;Stairs;Caring for Others;Reach Overhead;Bed Mobility;Bend;Transfers;Other   age-appropriate functional activities such as bed mobility, transfers, bending, lifting, carrying, lunging, running, squatting, walking, stairs, athletics, navigation across uneven ground,    Examination-Participation Restrictions  School;Community Activity;Driving;Other   athletics, community and social participation, and pet care   Stability/Clinical  Decision Making  Evolving/Moderate complexity    Rehab Potential  Good    Clinical Impairments  Affecting Rehab Potential  positive: good caregiver support, young in age, no co-morbidities; Negative: Chronicity, high fall risk;    PT Frequency  2x / week    PT Duration  12 weeks    PT Treatment/Interventions  Cryotherapy;Electrical Stimulation;Moist Heat;Gait training;Neuromuscular re-education;Balance training;Therapeutic exercise;Therapeutic activities;Functional mobility training;Stair training;Patient/family education;Orthotic Fit/Training;Energy conservation;Dry needling;Passive range of motion;Aquatic Therapy;ADLs/Self Care Home Management;DME Instruction;Manual techniques;Vestibular   cryotherapy/moist heat for pt comfort for soreness, relaxation, & body temperature regulation PRN; estim for improved muscle activation, relaxation of tight muscles, or soreness relief PRN; dry needling for improved soreness, decreased muscle tension PRN   PT Next Visit Plan  Modify HEP; Come to stand from prone, Ambulation with ball tossing, pivot turns, stepping over obstacles, jumping, bounding, lunging, floor to/from stand transfers, continue practicing with new AFO    PT Home Exercise Plan  New HEP provided on 02/13/19: [id number]    Consulted and Agree with Plan of Care  Patient       Patient will benefit from skilled therapeutic intervention in order to improve the following deficits and impairments:  Abnormal gait, Decreased cognition, Decreased mobility, Decreased coordination, Decreased activity tolerance, Decreased endurance, Decreased strength, Difficulty walking, Decreased safety awareness, Decreased balance, Impaired perceived functional ability, Impaired UE functional use, Decreased range of motion  Visit Diagnosis: Muscle weakness (generalized)  Unsteadiness on feet     Problem List There are no problems to display for this patient.  Phillips Grout PT, DPT, GCS  Huprich,Jason 04/16/2019, 9:13 AM  [place] MAIN [facility] SERVICES [address]  [place], [place], [address] Phone: [phone]   Fax:  [phone]  Name: Edgar [name] MRN: [id number] Date of Birth: [birth year]
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
## 2019-04-17 ENCOUNTER — Encounter: Payer: Self-pay | Admitting: Occupational Therapy

## 2019-04-17 ENCOUNTER — Ambulatory Visit: Payer: BC Managed Care – PPO

## 2019-04-17 ENCOUNTER — Ambulatory Visit: Payer: BC Managed Care – PPO | Admitting: Occupational Therapy

## 2019-04-17 ENCOUNTER — Other Ambulatory Visit: Payer: Self-pay

## 2019-04-17 DIAGNOSIS — R278 Other lack of coordination: Secondary | ICD-10-CM

## 2019-04-17 DIAGNOSIS — M6281 Muscle weakness (generalized): Secondary | ICD-10-CM | POA: Diagnosis not present
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
## 2019-04-22 ENCOUNTER — Ambulatory Visit: Payer: BC Managed Care – PPO

## 2019-04-22 ENCOUNTER — Ambulatory Visit: Payer: BC Managed Care – PPO | Admitting: Occupational Therapy

## 2019-04-24 ENCOUNTER — Ambulatory Visit: Payer: BC Managed Care – PPO

## 2019-04-24 ENCOUNTER — Other Ambulatory Visit: Payer: Self-pay

## 2019-04-24 ENCOUNTER — Ambulatory Visit: Payer: BC Managed Care – PPO | Attending: Physical Medicine and Rehabilitation | Admitting: Occupational Therapy

## 2019-04-24 DIAGNOSIS — M6281 Muscle weakness (generalized): Secondary | ICD-10-CM | POA: Diagnosis present

## 2019-04-24 DIAGNOSIS — R41841 Cognitive communication deficit: Secondary | ICD-10-CM | POA: Diagnosis present

## 2019-04-24 DIAGNOSIS — R278 Other lack of coordination: Secondary | ICD-10-CM | POA: Diagnosis present

## 2019-04-24 DIAGNOSIS — R2681 Unsteadiness on feet: Secondary | ICD-10-CM | POA: Diagnosis present
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
## 2019-04-29 ENCOUNTER — Ambulatory Visit: Payer: BC Managed Care – PPO | Admitting: Occupational Therapy

## 2019-04-29 ENCOUNTER — Other Ambulatory Visit: Payer: Self-pay

## 2019-04-29 ENCOUNTER — Encounter: Payer: Self-pay | Admitting: Occupational Therapy

## 2019-04-29 ENCOUNTER — Ambulatory Visit: Payer: BC Managed Care – PPO

## 2019-04-29 DIAGNOSIS — R278 Other lack of coordination: Secondary | ICD-10-CM

## 2019-04-29 DIAGNOSIS — M6281 Muscle weakness (generalized): Secondary | ICD-10-CM

## 2019-04-29 DIAGNOSIS — R41841 Cognitive communication deficit: Secondary | ICD-10-CM

## 2019-04-29 DIAGNOSIS — R2681 Unsteadiness on feet: Secondary | ICD-10-CM
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
## 2019-05-01 ENCOUNTER — Other Ambulatory Visit: Payer: Self-pay

## 2019-05-01 ENCOUNTER — Ambulatory Visit: Payer: BC Managed Care – PPO | Admitting: Occupational Therapy

## 2019-05-01 ENCOUNTER — Ambulatory Visit: Payer: BC Managed Care – PPO

## 2019-05-01 DIAGNOSIS — M6281 Muscle weakness (generalized): Secondary | ICD-10-CM

## 2019-05-01 DIAGNOSIS — R278 Other lack of coordination: Secondary | ICD-10-CM

## 2019-05-03 ENCOUNTER — Encounter: Payer: Self-pay | Admitting: Occupational Therapy
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
## 2019-05-06 ENCOUNTER — Ambulatory Visit: Payer: BC Managed Care – PPO | Admitting: Occupational Therapy

## 2019-05-06 ENCOUNTER — Encounter: Payer: Self-pay | Admitting: Occupational Therapy

## 2019-05-06 ENCOUNTER — Ambulatory Visit: Payer: BC Managed Care – PPO

## 2019-05-06 ENCOUNTER — Other Ambulatory Visit: Payer: Self-pay

## 2019-05-06 DIAGNOSIS — M6281 Muscle weakness (generalized): Secondary | ICD-10-CM

## 2019-05-06 DIAGNOSIS — R278 Other lack of coordination: Secondary | ICD-10-CM

## 2019-05-06 DIAGNOSIS — R2681 Unsteadiness on feet: Secondary | ICD-10-CM
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
## 2019-05-06 NOTE — Therapy (Signed)
[place] MAIN [facility] SERVICES [address] [place], [place], [address] Phone: [phone]   Fax:  [phone]  Physical Therapy Treatment  Patient Details  Name: Edgar [name] MRN: [id number] Date of Birth: 12-01-2000 No data recorded  Encounter Date: 05/06/2019  PT End of Session - 05/06/19 1611    Visit Number  [id number]    Number of Visits  410    Date for PT Re-Evaluation  06/03/19    Authorization Type  goals last updated on 04/02/19    Authorization Time Period  Medicaid authorization 1x/wk for 12 weeks (total of 12 visits): 02/27/19-05/20/18    Authorization - Visit Number  10    Authorization - Number of Visits  12    PT Start Time  8338    PT Stop Time  1645    PT Time Calculation (min)  38 min    Equipment Utilized During Treatment  Gait belt    Activity Tolerance  Patient tolerated treatment well    Behavior During Therapy  [facility] for tasks assessed/performed       History reviewed. No pertinent past medical history.  History reviewed. No pertinent surgical history.  There were no vitals filed for this visit.  Subjective Assessment - 05/06/19 1610    Subjective  Patient reported that he was doing well today. Denies any pain currently. Reports 1 fall since the last therapy session when he tripped over the comforter and    Pertinent History  personal factors affecting rehab: younger in age, time since initial injury, high fall risk, good caregiver support, going back to school so limited time available;     How long can you sit comfortably?  NA    How long can you stand comfortably?  able to stand a while without getting tired;     How long can you walk comfortably?  2-3 laps around a small track;     Diagnostic tests  None recent;     Patient Stated Goals  be able to pick up his 60 pound boxer/pit bull mix dog, Buster; to be able to get up from the floor without pushing off of an object with his hands.     Currently in Pain?   No/denies         TREATMENT    Ther-ex  TRX reverse lunges x 10 on each side; TRX rows x 10 with CGA from therapist; TRX push-up x 10 with CGA from therapist; Lean with it rock with it 2 x 10 (sitting on plinth, leaning posteriorly, using abdominal muscles to return to nuetral, PT held feet for counter balance) [employer] on therapy ball x 10 ea side tactile/visual cues and minA to get on/off ball (back on ball, trunk lateral flexion, cues to reach towards ankles) Reverse crunches 2x10 (supine, performing bilateral hip flexion) cued for breathing, posterior tilt) Isometric resisted oblique rotation with therapist providing resistance 3s hold 2 x 10 to each side; Hamstring roll in with bridge; therapy ball under heels, PT assist to roll ball towards glutes 2 x 5;  Single leg sit to stand from elevated mat table without UE support, min/modA+1 from therapist for assistance with single leg balance x 10 bilateral;   Pt educated throughout session about proper posture and technique with exercises. Improved exercise technique, movement at target joints, use of target muscles after min to mod verbal, visual, tactile cues    Pt demonstrated excellent motivation during session. He continues to be challenged  by abdominal and low back strengthening during session although notable improvement in strength compared to previous sessions. He requires min/modA+1 assist for his balance during single leg sit to stand exercise. The patient would benefit from further skilled PT intervention to continue to address deficits including abdominal strength/low back to improve functional abilities.                            PT Long Term Goals - 04/02/19 1623      PT LONG TERM GOAL #1   Title  Patient will improve 6 min walk test to >2000 feet to allow him to fully navigate the [facility] within appropriate time frames to transition between classes as well as  play basketball with his friends (2948' is distance predicted by 6MWT reference equation based on age, gender, height, and weight)       Baseline  10/05/17: 950 feet on level surface; 01/31/18: 1100 feet on level surface, 03/28/18: 1250 feet; 05/21/18: 1170'; 06/11/18: 1300'; 08/22/2018: 1200 feet R AFO, no AD; 10/01/18: 1225' no AFO, no AD; 10/31/18: 1335' AFO, no AD, cloth facemask, CGA; 12/05/18: 1370' AFO, no AD, cloth facemask, CGA; 12/26/18: 1370' AFO, no AD, cloth facemask, CGA; 02/11/19: 1385' without AFO, wearing a cloth facemask, CGA; 04/02/19: 1410' without assistive device with cloth facemask    Time  12    Period  Weeks    Status  Partially Met    Target Date  05/21/19      PT LONG TERM GOAL #2   Title  Patient will improve FGA score by at least 6 points to indicate improved postural control for better balance specifically with head turns during ambulation and backwards walking. These skills are critical for patient to be able to safely scan his environment during ambulation as well as safely participate in recreational activities such as basketball which would require him to regularly perform dynamic head turns and backwards stepping.     Baseline  01/31/18: 18/24, 03/28/18: 19/24; 05/22/18: 20/30; 08/20/2018: 23/30; 10/02/18: 23/30; 10/31/18: 23/30; 12/05/18: 25/30; 12/26/18: 26/30     Time  12    Period  Weeks    Status  Achieved      PT LONG TERM GOAL #3   Title  Pt will improve ABC by at least 13%, which will also exceed falls cut-off of 67%, in order to demonstrate clinically significant improvement in balance confidence and decrease his risk for future falls.    Baseline  10/01/18: 64.375%; 10/31/18: 84.69%    Time  12    Period  Weeks    Status  Achieved      PT LONG TERM GOAL #4   Title  Patient will demonstrate floor to/from stand transfer without UE support and no external assistance in the possibly that he needs to perform tasks at floor level or if he needs to stand from the ground after a  fall.    Baseline  08/20/2018: Requires L UE support on chair when rising from kneeling; 08/29/2018: required min A hand held assist when rising from tall kneeling; 10/01/18: Unable to transfer to/from floor without minA+1 assist from therapist as well as LUE support on a surface such as a chair or railing. 11/06/18: Able to perform with rear knee (right) on [employer] pad and BUE pressing through L knee, unable to perform from floor. 12/10/18: Able to perform with rear LE (left) on towel inconsistently requiring BUE through RLE  to come to stand; 12/26/18: able to perform with L knee on towel inconsistently requiring BUE through RLE to come to stand, unable to perform with R knee on towel without BUE support on // bars and mod/maxA from PT. ; 02/12/19: able to perform transfers bilaterally with CGA today, utilizing his knee on a towel for cushioning.     Time  12    Period  Weeks    Status  Achieved      PT LONG TERM GOAL #5   Title  Pt will increase LEFS by at least 9 points in order to demonstrate significant improvement in lower extremity function especially related to running and cutting which will allow him to participate in basketball with his friends.    Baseline  10/01/18: 51/80; 10/31/18: 59/80; 12/10/18: 62/80    Time  12    Period  Weeks    Status  Achieved      Additional Long Term Goals   Additional Long Term Goals  Yes      PT LONG TERM GOAL #6   Title  Pt will improve Mini BESTest by at least 4 points in order to demonstrate clinically significant improvement in his balance especially in the domains of vertical head turns, backwards walking, and eyes closed walking to allow him to safely participate in sports, scan his environment when walking across campus, and decrease his risk for falls in low light environments.     Baseline  10/01/18: 21/28; 10/31/18: 24/28; 12/05/18: 24/28; 12/26/18: 25/28    Time  12    Period  Weeks    Status  Achieved      PT LONG TERM GOAL #7   Title  Patient will  demonstrate prone to/from stand transfer without UE support and no external assistance in the possibly that he needs to perform tasks at floor level or if he needs to stand from the ground after a fall.    Baseline  02/11/19: Prone > quadruped > tall kneeling > 1/2 kneeling > stand attempted today, requiring modA to transition from prone to quadruped, minA from quadruped to tall kneeling, and maxA to come to stand.; 04/01/19: Prone > quadruped > tall kneeling > 1/2 kneeling > stand attempted today, requiring CGA to transition from prone to half kneeling and modA to come to stand with LOB to the left.      Time  12    Period  Weeks    Status  Partially Met    Target Date  05/21/19            Plan - 05/06/19 1614    Clinical Impression Statement  Pt demonstrated excellent motivation during session. He continues to be challenged by abdominal and low back strengthening during session although notable improvement in strength compared to previous sessions. He requires min/modA+1 assist for his balance during single leg sit to stand exercise. The patient would benefit from further skilled PT intervention to continue to address deficits including abdominal strength/low back to improve functional abilities.    Personal Factors and Comorbidities  Time since onset of injury/illness/exacerbation    Examination-Activity Limitations  Carry;Lift;Squat;Stairs;Caring for Others;Reach Overhead;Bed Mobility;Bend;Transfers;Other    Examination-Participation Restrictions  School;Community Activity;Driving;Other    Stability/Clinical Decision Making  Evolving/Moderate complexity    Rehab Potential  Good    Clinical Impairments Affecting Rehab Potential  positive: good caregiver support, young in age, no co-morbidities; Negative: Chronicity, high fall risk;    PT Frequency  2x / week  PT Duration  12 weeks    PT Treatment/Interventions  Cryotherapy;Electrical Stimulation;Moist Heat;Gait training;Neuromuscular  re-education;Balance training;Therapeutic exercise;Therapeutic activities;Functional mobility training;Stair training;Patient/family education;Orthotic Fit/Training;Energy conservation;Dry needling;Passive range of motion;Aquatic Therapy;ADLs/Self Care Home Management;DME Instruction;Manual techniques;Vestibular    PT Next Visit Plan  Modify HEP; Come to stand from prone, Ambulation with ball tossing, pivot turns, stepping over obstacles, jumping, bounding, lunging, floor to/from stand transfers, continue practicing with new AFO    PT Home Exercise Plan  New HEP provided on 02/13/19: [id number]    Consulted and Agree with Plan of Care  Patient       Patient will benefit from skilled therapeutic intervention in order to improve the following deficits and impairments:  Abnormal gait, Decreased cognition, Decreased mobility, Decreased coordination, Decreased activity tolerance, Decreased endurance, Decreased strength, Difficulty walking, Decreased safety awareness, Decreased balance, Impaired perceived functional ability, Impaired UE functional use, Decreased range of motion  Visit Diagnosis: Muscle weakness (generalized)  Unsteadiness on feet     Problem List There are no problems to display for this patient.  Phillips Grout PT, DPT, GCS  Deniss Wormley 05/06/2019, 5:05 PM  [place] MAIN [facility] SERVICES [address] [place], [place], [address] Phone: [phone]   Fax:  [phone]  Name: Edgar [name] MRN: [id number] Date of Birth: 06/28/2000

## 2019-05-08 ENCOUNTER — Ambulatory Visit: Payer: BC Managed Care – PPO | Admitting: Occupational Therapy

## 2019-05-08 ENCOUNTER — Other Ambulatory Visit: Payer: Self-pay

## 2019-05-08 ENCOUNTER — Ambulatory Visit: Payer: BC Managed Care – PPO

## 2019-05-08 DIAGNOSIS — M6281 Muscle weakness (generalized): Secondary | ICD-10-CM | POA: Diagnosis not present

## 2019-05-08 DIAGNOSIS — R278 Other lack of coordination: Secondary | ICD-10-CM

## 2019-05-09 ENCOUNTER — Encounter: Payer: Self-pay | Admitting: Occupational Therapy
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
## 2019-05-13 ENCOUNTER — Ambulatory Visit: Payer: BC Managed Care – PPO

## 2019-05-13 ENCOUNTER — Other Ambulatory Visit: Payer: Self-pay

## 2019-05-13 ENCOUNTER — Ambulatory Visit: Payer: BC Managed Care – PPO | Admitting: Occupational Therapy

## 2019-05-13 DIAGNOSIS — M6281 Muscle weakness (generalized): Secondary | ICD-10-CM | POA: Diagnosis not present

## 2019-05-13 DIAGNOSIS — R278 Other lack of coordination: Secondary | ICD-10-CM

## 2019-05-13 DIAGNOSIS — R2681 Unsteadiness on feet: Secondary | ICD-10-CM

## 2019-05-13 NOTE — Therapy (Signed)
[place] [facility] SERVICES [address] [place], [place], [address] Phone: [phone]   Fax:  [phone]  Physical Therapy Treatment  Patient Details  Name: Edgar [name] MRN: [id number] Date of Birth: [birth year] No data recorded  Encounter Date: 05/13/2019  PT End of Session - 05/14/19 0848    Visit Number  305    Number of Visits  410    Date for PT Re-Evaluation  06/03/19    Authorization Type  goals updated today 05/13/19    Authorization Time Period  Medicaid authorization 1x/wk for 12 weeks (total of 12 visits): 02/27/19-05/20/18    Authorization - Visit Number  11    Authorization - Number of Visits  12    PT Start Time  1600    PT Stop Time  1645    PT Time Calculation (min)  45 min    Equipment Utilized During Treatment  Gait belt    Activity Tolerance  Patient tolerated treatment well    Behavior During Therapy  [facility] for tasks assessed/performed       History reviewed. No pertinent past medical history.  History reviewed. No pertinent surgical history.  There were no vitals filed for this visit.  Subjective Assessment - 05/13/19 1605    Subjective  Patient reports that he is doing well today and denies any pain currently. No falls or stumbles since last therapy session. No specific questions or concerns currently.    Pertinent History  personal factors affecting rehab: younger in age, time since initial injury, high fall risk, good caregiver support, going back to school so limited time available;     How long can you sit comfortably?  NA    How long can you stand comfortably?  able to stand a while without getting tired;     How long can you walk comfortably?  2-3 laps around a small track;     Diagnostic tests  None recent;     Patient Stated Goals  be able to pick up his 60 pound boxer/pit bull mix dog, Buster; to be able to get up from the floor without pushing off of an object with his hands.     Currently in Pain?   No/denies         [facility] PT Assessment - 05/13/19 1614      6 Minute Walk- Baseline   6 Minute Walk- Baseline  yes    BP (mmHg)  130/69    HR (bpm)  64    02 Sat (%RA)  99 %    Modified Borg Scale for Dyspnea  0- Nothing at all    Perceived Rate of Exertion (Borg)  6-      6 Minute walk- Post Test   6 Minute Walk Post Test  yes    BP (mmHg)  173/75    HR (bpm)  135    02 Sat (%RA)  99 %    Modified Borg Scale for Dyspnea  6-    Perceived Rate of Exertion (Borg)  17- Very hard      6 minute walk test results    Aerobic Endurance Distance Walked  1430    Endurance additional comments  no AD; wearing a facemask throughout, 1 LOB requiring minA+1 from therapist to correct            TREATMENT   Ther-ex ProStretch bilateral calf stretch 30s hold x 2; Updated 6MWT with patient who ambulated 1430' without assistive  device, no AFO, facemask, minA+1 for 1 LOB; Extensive time performing floor to stand transfers with patient today. He is able to roll from stomach onto back and then transfer to long sitting. From longsitting he is able to roll onto knees and get into tall kneeling followed by half kneeling. He is able to perform all of this with CGA only. He demonstrates improvement in foot positioning today once in tall kneeling and is able to get his L toes under his foot. However this requires a lot of effort and by the time he is in position he is very fatigued. While standing pt requires min/modA+1 assist due to LOB to the L during stand and weakness coming to full standing position;   Pt educated throughout session about proper posture and technique with exercises. Improved exercise technique, movement at target joints, use of target muscles after min to mod verbal, visual, tactile cues   Ptcontinues todemonstrateexcellentmotivation throughouthis PTsessions. Updated outcome measures with patient today that he has not yet achieved. He has improved his 6MWT  from1410' during last update to 1430' today. Pt does have one LOB requiring minA+1 from therapist to prevent him from falling. His total distance stilll fallssignificantly below the his age and gender predicted norm of 2923f. He continues to demonstrate improved strength with floor to stand transfers today. He is able to roll from stomach onto back and then transfer to long sitting. From longsitting he is able to roll onto knees and get into tall kneeling followed by half kneeling. He is able to perform all of this with CGA only. He demonstrates improvement in foot positioning today once in tall kneeling and is able to get his L toes under his foot. However this requires a lot of effort and by the time he is in position he is very fatigued. While standing pt requires min/modA+1 assist due to LOB to the L during stand and weakness coming to full standing position. Pt has one more scheduled appointment at which point he will be discharged to continue his exercises independently. Next session will focus on extensive review of HEP to ensure continued compliance.He will continue to benefit from PT services to address deficits in strength, balance, and mobility in order to return to full function at home and school.                           PT Long Term Goals - 05/14/19 0851      PT LONG TERM GOAL #1   Title  Patient will improve 6 min walk test to >2000 feet to allow [facility] to transition between classes as well as play basketball with his friends (2948' is distance predicted by 6MWT reference equation based on age, gender, height, and weight)       Baseline  10/05/17: 950 feet on level surface; 01/31/18: 1100 feet on level surface, 03/28/18: 1250 feet; 05/21/18: 1170'; 06/11/18: 1300'; 08/22/2018: 1200 feet R AFO, no AD; 10/01/18: 1225' no AFO, no AD; 10/31/18: 1335' AFO, no AD, cloth facemask, CGA; 12/05/18:  1370' AFO, no AD, cloth facemask, CGA; 12/26/18: 1370' AFO, no AD, cloth facemask, CGA; 02/11/19: 1385' without AFO, wearing a cloth facemask, CGA; 04/02/19: 1410' without assistive device with cloth facemask; 05/13/19: 1430' no assistive device, face mask donned, minA+1 for 1 LOB    Time  12    Period  Weeks    Status  Partially Met    Target Date  05/21/19      PT LONG TERM GOAL #2   Title  Patient will improve FGA score by at least 6 points to indicate improved postural control for better balance specifically with head turns during ambulation and backwards walking. These skills are critical for patient to be able to safely scan his environment during ambulation as well as safely participate in recreational activities such as basketball which would require him to regularly perform dynamic head turns and backwards stepping.     Baseline  01/31/18: 18/24, 03/28/18: 19/24; 05/22/18: 20/30; 08/20/2018: 23/30; 10/02/18: 23/30; 10/31/18: 23/30; 12/05/18: 25/30; 12/26/18: 26/30     Time  12    Period  Weeks    Status  Achieved      PT LONG TERM GOAL #3   Title  Pt will improve ABC by at least 13%, which will also exceed falls cut-off of 67%, in order to demonstrate clinically significant improvement in balance confidence and decrease his risk for future falls.    Baseline  10/01/18: 64.375%; 10/31/18: 84.69%    Time  12    Period  Weeks    Status  Achieved      PT LONG TERM GOAL #4   Title  Patient will demonstrate floor to/from stand transfer without UE support and no external assistance in the possibly that he needs to perform tasks at floor level or if he needs to stand from the ground after a fall.    Baseline  08/20/2018: Requires L UE support on chair when rising from kneeling; 08/29/2018: required min A hand held assist when rising from tall kneeling; 10/01/18: Unable to transfer to/from floor without minA+1 assist from therapist as well as LUE support on a surface such as a chair or railing. 11/06/18: Able to  perform with rear knee (right) on Airex pad and BUE pressing through L knee, unable to perform from floor. 12/10/18: Able to perform with rear LE (left) on towel inconsistently requiring BUE through RLE to come to stand; 12/26/18: able to perform with L knee on towel inconsistently requiring BUE through RLE to come to stand, unable to perform with R knee on towel without BUE support on // bars and mod/maxA from PT. ; 02/12/19: able to perform transfers bilaterally with CGA today, utilizing his knee on a towel for cushioning.     Time  12    Period  Weeks    Status  Achieved      PT LONG TERM GOAL #5   Title  Pt will increase LEFS by at least 9 points in order to demonstrate significant improvement in lower extremity function especially related to running and cutting which will allow him to participate in basketball with his friends.    Baseline  10/01/18: 51/80; 10/31/18: 59/80; 12/10/18: 62/80    Time  12    Period  Weeks    Status  Achieved      PT LONG TERM GOAL #6   Title  Pt will improve Mini BESTest by at least 4 points in order to demonstrate clinically significant improvement in his balance especially in the domains of vertical head turns, backwards walking, and eyes closed walking to allow him to safely participate in sports, scan his environment when walking across campus, and decrease his risk for falls in low light environments.     Baseline  10/01/18: 21/28; 10/31/18: 24/28; 12/05/18: 24/28; 12/26/18: 25/28    Time  12  Period  Weeks    Status  Achieved      PT LONG TERM GOAL #7   Title  Patient will demonstrate prone to/from stand transfer without UE support and no external assistance in the possibly that he needs to perform tasks at floor level or if he needs to stand from the ground after a fall.    Baseline  02/11/19: Prone > quadruped > tall kneeling > 1/2 kneeling > stand attempted today, requiring modA to transition from prone to quadruped, minA from quadruped to tall kneeling, and  maxA to come to stand.; 04/01/19: Prone > quadruped > tall kneeling > 1/2 kneeling > stand attempted today, requiring CGA to transition from prone to half kneeling and modA to come to stand with LOB to the left. 05/13/19: Prone > quadruped > tall kneeling > 1/2 kneeling > stand attempted today, requiring CGA to transition from prone to half kneeling and minA to come to stand with LOB to the left. Improved from previous goal updated, pt still demonstrates fatigue once he is in half kneeling.    Time  12    Period  Weeks    Status  Partially Met    Target Date  05/21/19            Plan - 05/14/19 0849    Clinical Impression Statement  Pt continues to demonstrate excellent motivation throughout his PT sessions. Updated outcome measures with patient today that he has not yet achieved. He has improved his 6MWT from 1410' during last update to 1430' today. Pt does have one LOB requiring minA+1 from therapist to prevent him from falling. His total distance stilll falls significantly below the his age and gender predicted norm of 2928f.  He continues to demonstrate improved strength with floor to stand transfers today. He is able to roll from stomach onto back and then transfer to long sitting. From longsitting he is able to roll onto knees and get into tall kneeling followed by half kneeling. He is able to perform all of this with CGA only. He demonstrates improvement in foot positioning today once in tall kneeling and is able to get his L toes under his foot. However this requires a lot of effort and by the time he is in position he is very fatigued. While standing pt requires min/modA+1 assist due to LOB to the L during stand and weakness coming to full standing position. Pt has one more scheduled appointment at which point he will be discharged to continue his exercises independently. Next session will focus on extensive review of HEP to ensure continued compliance.  He will continue to benefit from PT  services to address deficits in strength, balance, and mobility in order to return to full function at home and school.    Personal Factors and Comorbidities  Time since onset of injury/illness/exacerbation    Examination-Activity Limitations  Carry;Lift;Squat;Stairs;Caring for Others;Reach Overhead;Bed Mobility;Bend;Transfers;Other    Examination-Participation Restrictions  School;Community Activity;Driving;Other    Stability/Clinical Decision Making  Evolving/Moderate complexity    Rehab Potential  Good    Clinical Impairments Affecting Rehab Potential  positive: good caregiver support, young in age, no co-morbidities; Negative: Chronicity, high fall risk;    PT Frequency  2x / week    PT Duration  12 weeks    PT Treatment/Interventions  Cryotherapy;Electrical Stimulation;Moist Heat;Gait training;Neuromuscular re-education;Balance training;Therapeutic exercise;Therapeutic activities;Functional mobility training;Stair training;Patient/family education;Orthotic Fit/Training;Energy conservation;Dry needling;Passive range of motion;Aquatic Therapy;ADLs/Self Care Home Management;DME Instruction;Manual techniques;Vestibular    PT Next Visit  Plan  Extensive HEP review with updates as needed and discharge    PT Home Exercise Plan  New HEP provided on 02/13/19: [id number]    Consulted and Agree with Plan of Care  Patient       Patient will benefit from skilled therapeutic intervention in order to improve the following deficits and impairments:  Abnormal gait, Decreased cognition, Decreased mobility, Decreased coordination, Decreased activity tolerance, Decreased endurance, Decreased strength, Difficulty walking, Decreased safety awareness, Decreased balance, Impaired perceived functional ability, Impaired UE functional use, Decreased range of motion  Visit Diagnosis: Muscle weakness (generalized)  Other lack of coordination  Unsteadiness on feet     Problem List There are no problems to display  for this patient.  Phillips Grout PT, DPT, GCS  Meggen Spaziani 05/14/2019, 4:35 PM  [place] [facility] SERVICES [address] [place], [place], [address] Phone: [phone]   Fax:  [phone]  Name: ERMAL HABERER MRN: [id number] Date of Birth: 11-12-2000

## 2019-05-14 ENCOUNTER — Encounter: Payer: Self-pay | Admitting: Occupational Therapy
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
## 2019-05-15 ENCOUNTER — Ambulatory Visit: Payer: BC Managed Care – PPO | Admitting: Occupational Therapy

## 2019-05-15 ENCOUNTER — Other Ambulatory Visit: Payer: Self-pay

## 2019-05-15 ENCOUNTER — Ambulatory Visit: Payer: BC Managed Care – PPO

## 2019-05-15 ENCOUNTER — Encounter: Payer: Self-pay | Admitting: Occupational Therapy

## 2019-05-15 DIAGNOSIS — M6281 Muscle weakness (generalized): Secondary | ICD-10-CM | POA: Diagnosis not present

## 2019-05-15 DIAGNOSIS — R278 Other lack of coordination: Secondary | ICD-10-CM
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
## 2019-05-20 ENCOUNTER — Ambulatory Visit: Payer: BC Managed Care – PPO | Attending: Physical Medicine and Rehabilitation | Admitting: Occupational Therapy

## 2019-05-20 ENCOUNTER — Encounter: Payer: Self-pay | Admitting: Occupational Therapy

## 2019-05-20 ENCOUNTER — Other Ambulatory Visit: Payer: Self-pay

## 2019-05-20 ENCOUNTER — Ambulatory Visit: Payer: BC Managed Care – PPO

## 2019-05-20 DIAGNOSIS — M6281 Muscle weakness (generalized): Secondary | ICD-10-CM

## 2019-05-20 DIAGNOSIS — R2681 Unsteadiness on feet: Secondary | ICD-10-CM | POA: Insufficient documentation

## 2019-05-20 DIAGNOSIS — R278 Other lack of coordination: Secondary | ICD-10-CM | POA: Insufficient documentation

## 2019-05-20 NOTE — Patient Instructions (Signed)
Access Code: [email or web address] URL: [email or web address] Date: 05/20/2019 Prepared by: Ria Comment  Exercises Squat - 10 reps - 2 sets - 1x daily - 3x weekly Standard Lunge - 10 reps - 1 sets - 1x daily - 3x weekly Lateral Lunge - 10 reps - 1 sets - 1x daily - 3x weekly Lateral Single Leg Lunge Jumps - 10 reps - 2 sets - 1x daily - 3x weekly Half-Kneeling Trunk Rotation - 10 reps - 1 sets - 1x daily - 3x weekly Tandem Stance in Corner - 3 reps - 30s x 3 with each foot forward hold - 1x daily - 3x weekly Single Leg Stance with Support - 3 reps - 30s x 3 on each leg hold - 1x daily - 3x weekly Standing Heel Raise with Support - 10 reps - 2 sets - 3s hold - 1x daily - 3x weekly Standing Toe Raises at Chair - 10 reps - 2 sets - 3s hold - 1x daily - 3x weekly
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
## 2019-05-22 ENCOUNTER — Encounter: Payer: Self-pay | Admitting: Occupational Therapy

## 2019-05-22 ENCOUNTER — Ambulatory Visit: Payer: BC Managed Care – PPO | Admitting: Occupational Therapy

## 2019-05-22 ENCOUNTER — Other Ambulatory Visit: Payer: Self-pay

## 2019-05-22 DIAGNOSIS — M6281 Muscle weakness (generalized): Secondary | ICD-10-CM | POA: Diagnosis not present

## 2019-05-22 DIAGNOSIS — R278 Other lack of coordination: Secondary | ICD-10-CM
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
## 2019-05-27 ENCOUNTER — Encounter: Payer: Self-pay | Admitting: Occupational Therapy

## 2019-05-27 ENCOUNTER — Other Ambulatory Visit: Payer: Self-pay

## 2019-05-27 ENCOUNTER — Ambulatory Visit: Payer: BC Managed Care – PPO

## 2019-05-27 ENCOUNTER — Ambulatory Visit: Payer: BC Managed Care – PPO | Admitting: Occupational Therapy

## 2019-05-27 DIAGNOSIS — M6281 Muscle weakness (generalized): Secondary | ICD-10-CM | POA: Diagnosis not present

## 2019-05-27 DIAGNOSIS — R278 Other lack of coordination: Secondary | ICD-10-CM
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
## 2019-05-29 ENCOUNTER — Encounter: Payer: Self-pay | Admitting: Occupational Therapy

## 2019-05-29 ENCOUNTER — Other Ambulatory Visit: Payer: Self-pay

## 2019-05-29 ENCOUNTER — Ambulatory Visit: Payer: BC Managed Care – PPO | Admitting: Occupational Therapy

## 2019-05-29 DIAGNOSIS — M6281 Muscle weakness (generalized): Secondary | ICD-10-CM | POA: Diagnosis not present

## 2019-05-29 DIAGNOSIS — R278 Other lack of coordination: Secondary | ICD-10-CM
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
## 2019-06-03 ENCOUNTER — Ambulatory Visit: Payer: BC Managed Care – PPO

## 2019-06-03 ENCOUNTER — Ambulatory Visit: Payer: BC Managed Care – PPO | Admitting: Occupational Therapy

## 2019-06-05 ENCOUNTER — Other Ambulatory Visit: Payer: Self-pay

## 2019-06-05 ENCOUNTER — Ambulatory Visit: Payer: BC Managed Care – PPO | Admitting: Occupational Therapy

## 2019-06-05 DIAGNOSIS — M6281 Muscle weakness (generalized): Secondary | ICD-10-CM | POA: Diagnosis not present

## 2019-06-05 DIAGNOSIS — R278 Other lack of coordination: Secondary | ICD-10-CM

## 2019-06-05 NOTE — Therapy (Signed)
[facility] MAIN [facility] SERVICES [address] [place], [place], [address] Phone: [phone]   Fax:  [phone]  Occupational Therapy Treatment/Medicaid Reauthorization  Patient Details  Name: Edgar [name] MRN: [id number] Date of Birth: 2001/01/09 No data recorded  Encounter Date: 06/05/2019  OT End of Session - 06/05/19 1452    Visit Number  [id number]    Number of Visits  405    Date for OT Re-Evaluation  07/22/19    Authorization Type  Progress report period starting 03/25/2019    Authorization Time Period  Medicaid authorization: 03/20/2019-06/11/2019 24 visits    OT Start Time  1435    OT Stop Time  1520    OT Time Calculation (min)  45 min    Activity Tolerance  Patient tolerated treatment well    Behavior During Therapy  [facility] for tasks assessed/performed      Measurements were obtained, and goals were reviewed with the patient.  There. Ex.  Pt. Worked on the [employer] for 8 min. With constant monitoring of the BUEs. Pt. Worked on changing, and alternating forward reverse position every 2 min. Pt. Worked on level 8.5 with the seat distance set at 11 to encourage right elbow extension.  Response to Treatment:  Pt. is steadily improving with daily ADL, and IADL care tasks. Pt. is improving with shoulder ROM required for brushing his hair, and reaching to place items in higher refrigerator shelves. Pt. continues to present with limited UE strength required for sustaining his UEs in elevation long enough to wash his hair as pt. is unable to keep his UEs elevated longer than 30 sec. at time while using his hands. The limited strength hinders him from being able to place, and retrieve larger items from higher shelf levels. Pt. is progressing with donning his left contact lens using an applicator with minA to hold the eyelid open. Pt. requires MaxA to apply the righ contact lens with the applicator. Pt. needs to continue to work on these tasks in order to  be able to work towards achieving independence with them, as pt. continues to work towards progressing to independent living.                              OT Long Term Goals - 06/05/19 1618      OT LONG TERM GOAL #1   Title  Pt. will increase UE shoulder flexion to 90 degrees bilaterally to assist with reaching to place items on the top refrigerator shelf.    Baseline  04/29/2019: Pt.'s shoulder flexion has improved R: 90, left 112. Pt. is now able to reach up and place items in the refrigerator. Pt. requires assist at his right elbow when reaching to place items on the top shelf of the refrigerator. 12/17/2018:R: 86, L: 87 (New onset 7/10 pain with shoulder flexion which limits reaching on the left. Reaching with the right improving. Pt. is able to reach to place items on higher shelves. 11/28/18: R: 80, L: 103 Pt. is improving with reaching to the top shelf, however continues to have difficulty placing items onto the top shelf  of the refrigerator. 10/17/2018: Pt. continues to have full AROM in supine. shoulder flexion has improved. R: 78, Left 103. Pt. is now able to reach to remove items from the top shelf of the refrigerator, Pt. has difficulty reaching to place items on top shelf of the refrigerator. 08/20/2018: Pt. continues  to have full AROM in supine. Shoulder flexion has progressed  in sititng to Right: 78, left: 80, Pt. is now able to donn his shirt independentlly. Pt. has difficulty reaching up to place heavier items on the top shelf of the refrigerator.04/23/2018: Pt. Has progressed to full AROM for shoulder flexion in supine. Pt. continues to present with limited bilateral shoulder ROM in sitting. Right: sitting: 60, Left 78. Pt. has progressed to independence with donning his shirt using a modified technique to bring the shirt over his head while in sitting. Pt. requires increased time to complete.  03/15/2019:  Patient has made improvements in active ROM in sitting with  right shoulder but continues to demonstrate difficulty with reaching and placing items on shelves greater than shoulder height, especially if items are weighted or heavy.  He continues to demo difficulty with reaching up to wash and style his hair.    Time  12    Period  Weeks    Status  Achieved      OT LONG TERM GOAL #2   Title  Pt. will improve UE  shoulder abduction by 10 degrees to be able to brush hair.     Baseline  06/05/2019: Shoulder Abduction Right 95, Left: 104 Pt. uses his right UE to brush the right side of his head, and the back of hiar hair. pt. uses the LUE to brush the left side of his hair, and the top of his head. 04/29/2019: Shoulder abduction has improved right: 92, lef: 95. Pt. continues to have using his right UE to to brush both sides of his head.    Time  12    Period  Weeks    Status  On-going      OT LONG TERM GOAL #3   Title  Pt. will be modified independent with light IADL home management tasks.    Baseline  06/05/2019: Pt. continues to progress with IADL home management tasks. 1/112021: Pt. has progressed and is able to feed his dog, put dishes away, assist with laundry,  bedmaking tasks, and is able to take the trash out..    Time  12    Period  Weeks    Status  On-going      OT LONG TERM GOAL #4   Title  Pt. will be modified independent with baking using the oven.    Baseline  06/05/2019: Pt. is independent with microwave oven use, independent using the stovetop, and requires supervision with usng the oven. 04/29/2019: Pt. is able to perform meal preparation using the stovetop, and microwave oven. Pt. however does not put items in the oven. Pt. continues to require Supervision for complex meal preparation.Pt. s to be able able to prepare light meals independently, and heat items in the microwave which is positioned on an elevated shelf. Pt. Is able to prepare simple meals, however Supervision assistance for more complex meals.  03/13/2019:  Patient able to complete  simple meal prep and microwave use but continues to require some assistance with more complex meal preparation, managing heavy pots/pans with hot items.  Difficulty with obtaining items from overhead shelves.    Time  12    Period  Weeks    Status  Revised      OT LONG TERM GOAL #6   Title  Pt. will independently, legibly, and efficiently write a 3 sentence paragraph for school related tasks.    Baseline  Pt. reports that he is able to write efficiently for the tasks  the he needs to complete, however is mostly typing during online classes.    Time  12    Period  Weeks    Status  Deferred      OT LONG TERM GOAL  #9   Baseline  Pt. will be able to independently throw a baseball with his RUE to be able to play fetch with his dog.    Time  12    Period  Weeks    Status  On-going      OT LONG TERM GOAL  #11   TITLE  Pt. will increase BUE strength to be able to sustain his BUEs in elevation to be able to wash hair while standing    Baseline  06/05/2019: Pt. is able to sustain BUEs in elevation while washing hair for approximately 30 sec. before requiring rest breaks presenting with increased compensation proximally, and with flexing his head and neck.04/29/2019: Pt. continues to have difficulty sustaining BUE's in elevation while washing hair.    Time  12    Period  Weeks    Status  On-going      OT LONG TERM GOAL  #12   TITLE  Pt. will independently, and efficiently perform typing tasks for college related coursework, and papers.    Baseline   06/05/2019: Pt. presents with 96% accuracy, 21wpm, 545 characters in . with 4 errors. 04/29/2019:Pt. continues to present with limited typing speed needed for college related courses.  04/23/2018: Typing speed 22 wpm with 96% accuracy on a laptop computer.  03/13/2019:  Did not perform typing test this date due to lack of time however, patient reports he is performing around 30-32 WPM currently.    Time  12    Period  Weeks      OT LONG TERM GOAL  #13    TITLE  Pt. require minA to use both hands to put contacts lens in.    Baseline  06/05/2019: Pt. is able to independently use a contact lens applicator for the left eye with minA to hold the eyelids open. Pt. is maxA with the right eye.04/29/2019: Pt. is making progress, and can indepedently remove the contacts, Pt. requires increased time with multiple trials apllying contacts. Pt. requires assist the positioning the contacts on his fingers and holding the eyelids open while applying them. Pt. drops them from his fingertips at times.    Time  12    Period  Weeks    Status  On-going      OT LONG TERM GOAL  #14   TITLE  Pt. will independently hold, and use a cellphone with his right hand    Baseline  04/29/2019: Independent    Time  12    Period  Weeks    Status  Achieved      OT LONG TERM GOAL  #15   TITLE  Pt. will be able to independenlty retrieve a weighted bowl from a kitchen cabinet shelf.    Baseline  06/05/2019: Pt. requires maxA to perform    Time  12    Period  Weeks    Status  New            Plan - 06/05/19 1453    Clinical Impression Statement Pt. is steadily improving with daily ADL, and IADL care tasks. Pt. is improving with shoulder ROM required for brushing his hair, and reaching to place items in higher refrigerator shelves. Pt. continues to present with limited UE strength required for sustaining his  UEs in elevation long enough to wash his hair as pt. is unable to keep his UEs elevated longer than 30 sec. at time while using his hands. The limited strength hinders him from being able to place, and retrieve larger items from higher shelf levels. Pt. is progressing with donning his left contact lens using an applicator with minA to hold the eyelid open. Pt. requires MaxA to apply the righ contact lens with the applicator. Pt. needs to continue to work on these tasks in order to be able to work towards achieving independence with them, as pt. continues to work towards  progressing to independent living.    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    Occupational performance deficits (Please refer to evaluation for details):  ADL's;IADL's    Body Structure / Function / Physical Skills  ADL;Flexibility;ROM;UE functional use;Balance;Endurance;FMC;Mobility;Strength;Coordination;Dexterity;IADL;Tone    Cognitive Skills  Attention    Psychosocial Skills  Environmental  Adaptations;Routines and Behaviors;Habits    Rehab Potential  Good    Clinical Decision Making  Several treatment options, min-mod task modification necessary    Comorbidities Affecting Occupational Performance:  Presence of comorbidities impacting occupational performance    Modification or Assistance to Complete Evaluation   Min-Moderate modification of tasks or assist with assess necessary to complete eval    OT Frequency  2x / week    OT Duration  12 weeks    OT Treatment/Interventions  Self-care/ADL training;DME and/or AE instruction;Therapeutic exercise;Patient/family education;Passive range of motion;Therapeutic activities    Consulted and Agree with Plan of Care  Patient    Family Member Consulted  mother       Patient will benefit from skilled therapeutic intervention in order to improve the following deficits and impairments:   Body Structure / Function / Physical Skills: ADL, Flexibility, ROM, UE functional use, Balance, Endurance, FMC, Mobility, Strength, Coordination, Dexterity, IADL, Tone Cognitive Skills: Attention Psychosocial Skills: Environmental  Adaptations, Routines and Behaviors, Habits   Visit Diagnosis: Muscle weakness (generalized)  Other lack of  coordination    Problem List There are no problems to display for this patient.   Harrel Carina, MS, OTR/L 06/05/2019, 5:04 PM  [place] MAIN [facility] SERVICES [address] [place], [place], [address] Phone: [phone]   Fax:  [phone]  Name: CHRISTIANJAMES SOULE MRN: [id number] Date of Birth: [birth year]

## 2019-06-10 ENCOUNTER — Ambulatory Visit: Payer: BC Managed Care – PPO | Admitting: Occupational Therapy

## 2019-06-10 ENCOUNTER — Other Ambulatory Visit: Payer: Self-pay

## 2019-06-10 ENCOUNTER — Encounter: Payer: Self-pay | Admitting: Occupational Therapy

## 2019-06-10 ENCOUNTER — Ambulatory Visit: Payer: BC Managed Care – PPO

## 2019-06-10 DIAGNOSIS — R278 Other lack of coordination: Secondary | ICD-10-CM

## 2019-06-10 DIAGNOSIS — M6281 Muscle weakness (generalized): Secondary | ICD-10-CM
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
## 2019-06-12 ENCOUNTER — Encounter: Payer: Self-pay | Admitting: Occupational Therapy

## 2019-06-12 ENCOUNTER — Ambulatory Visit: Payer: BC Managed Care – PPO | Admitting: Occupational Therapy

## 2019-06-12 DIAGNOSIS — M6281 Muscle weakness (generalized): Secondary | ICD-10-CM | POA: Diagnosis not present
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
## 2019-06-17 ENCOUNTER — Ambulatory Visit: Payer: BC Managed Care – PPO

## 2019-06-17 ENCOUNTER — Ambulatory Visit: Payer: BC Managed Care – PPO | Attending: Physical Medicine and Rehabilitation | Admitting: Occupational Therapy

## 2019-06-17 ENCOUNTER — Other Ambulatory Visit: Payer: Self-pay

## 2019-06-17 DIAGNOSIS — M6281 Muscle weakness (generalized): Secondary | ICD-10-CM | POA: Diagnosis present

## 2019-06-17 DIAGNOSIS — R278 Other lack of coordination: Secondary | ICD-10-CM | POA: Insufficient documentation

## 2019-06-18 ENCOUNTER — Encounter: Payer: Self-pay | Admitting: Occupational Therapy
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
## 2019-06-19 ENCOUNTER — Ambulatory Visit: Payer: BC Managed Care – PPO | Admitting: Occupational Therapy

## 2019-06-19 ENCOUNTER — Other Ambulatory Visit: Payer: Self-pay

## 2019-06-19 ENCOUNTER — Encounter: Payer: Self-pay | Admitting: Occupational Therapy

## 2019-06-19 DIAGNOSIS — M6281 Muscle weakness (generalized): Secondary | ICD-10-CM

## 2019-06-19 DIAGNOSIS — R278 Other lack of coordination: Secondary | ICD-10-CM
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
## 2019-06-24 ENCOUNTER — Ambulatory Visit: Payer: BC Managed Care – PPO | Admitting: Occupational Therapy

## 2019-06-24 ENCOUNTER — Ambulatory Visit: Payer: BC Managed Care – PPO

## 2019-06-24 ENCOUNTER — Other Ambulatory Visit: Payer: Self-pay

## 2019-06-24 DIAGNOSIS — M6281 Muscle weakness (generalized): Secondary | ICD-10-CM

## 2019-06-24 DIAGNOSIS — R278 Other lack of coordination: Secondary | ICD-10-CM
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
## 2019-06-26 ENCOUNTER — Encounter: Payer: Self-pay | Admitting: Occupational Therapy

## 2019-06-26 ENCOUNTER — Other Ambulatory Visit: Payer: Self-pay

## 2019-06-26 ENCOUNTER — Ambulatory Visit: Payer: BC Managed Care – PPO | Admitting: Occupational Therapy

## 2019-06-26 DIAGNOSIS — M6281 Muscle weakness (generalized): Secondary | ICD-10-CM | POA: Diagnosis not present

## 2019-06-26 DIAGNOSIS — R278 Other lack of coordination: Secondary | ICD-10-CM
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
## 2019-07-01 ENCOUNTER — Ambulatory Visit: Payer: BC Managed Care – PPO

## 2019-07-01 ENCOUNTER — Ambulatory Visit: Payer: BC Managed Care – PPO | Admitting: Occupational Therapy

## 2019-07-01 ENCOUNTER — Encounter: Payer: Self-pay | Admitting: Occupational Therapy

## 2019-07-01 ENCOUNTER — Other Ambulatory Visit: Payer: Self-pay

## 2019-07-01 DIAGNOSIS — R278 Other lack of coordination: Secondary | ICD-10-CM

## 2019-07-01 DIAGNOSIS — M6281 Muscle weakness (generalized): Secondary | ICD-10-CM | POA: Diagnosis not present
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
## 2019-07-03 ENCOUNTER — Ambulatory Visit: Payer: BC Managed Care – PPO | Admitting: Occupational Therapy

## 2019-07-03 ENCOUNTER — Other Ambulatory Visit: Payer: Self-pay

## 2019-07-03 ENCOUNTER — Encounter: Payer: Self-pay | Admitting: Occupational Therapy

## 2019-07-03 DIAGNOSIS — M6281 Muscle weakness (generalized): Secondary | ICD-10-CM | POA: Diagnosis not present

## 2019-07-03 DIAGNOSIS — R278 Other lack of coordination: Secondary | ICD-10-CM
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
## 2019-07-07 ENCOUNTER — Ambulatory Visit: Payer: BC Managed Care – PPO | Attending: Internal Medicine

## 2019-07-07 DIAGNOSIS — Z23 Encounter for immunization: Secondary | ICD-10-CM

## 2019-07-07 NOTE — Progress Notes (Signed)
   Covid-19 Vaccination Clinic  Name:  Edgar [name]    MRN: [id number] DOB: 2000/07/11  07/07/2019  Mr. [name] was observed post Covid-19 immunization for 15 minutes without incident. He was provided with Vaccine Information Sheet and instruction to access the V-Safe system.   Mr. [name] was instructed to call 911 with any severe reactions post vaccine: [name] Difficulty breathing  . Swelling of face and throat  . A fast heartbeat  . A bad rash all over body  . Dizziness and weakness   Immunizations Administered    Name Date Dose VIS Date Route   Pfizer COVID-19 Vaccine 07/07/2019  3:16 PM 0.3 mL 03/29/2019 Intramuscular   Manufacturer: [employer], [employer]   Lot: [id number]   NDC: [address]-2

## 2019-07-08 ENCOUNTER — Ambulatory Visit: Payer: BC Managed Care – PPO

## 2019-07-08 ENCOUNTER — Ambulatory Visit: Payer: BC Managed Care – PPO | Admitting: Occupational Therapy

## 2019-07-08 ENCOUNTER — Other Ambulatory Visit: Payer: Self-pay

## 2019-07-08 ENCOUNTER — Encounter: Payer: Self-pay | Admitting: Occupational Therapy

## 2019-07-08 DIAGNOSIS — M6281 Muscle weakness (generalized): Secondary | ICD-10-CM

## 2019-07-08 DIAGNOSIS — R278 Other lack of coordination: Secondary | ICD-10-CM
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
## 2019-07-10 ENCOUNTER — Encounter: Payer: Self-pay | Admitting: Occupational Therapy

## 2019-07-10 ENCOUNTER — Other Ambulatory Visit: Payer: Self-pay

## 2019-07-10 ENCOUNTER — Ambulatory Visit: Payer: BC Managed Care – PPO | Admitting: Occupational Therapy

## 2019-07-10 DIAGNOSIS — M6281 Muscle weakness (generalized): Secondary | ICD-10-CM | POA: Diagnosis not present

## 2019-07-10 DIAGNOSIS — R278 Other lack of coordination: Secondary | ICD-10-CM
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
## 2019-07-11 ENCOUNTER — Encounter: Payer: Self-pay | Admitting: Occupational Therapy

## 2019-07-15 ENCOUNTER — Ambulatory Visit: Payer: BC Managed Care – PPO

## 2019-07-15 ENCOUNTER — Ambulatory Visit: Payer: BC Managed Care – PPO | Admitting: Occupational Therapy

## 2019-07-15 ENCOUNTER — Other Ambulatory Visit: Payer: Self-pay

## 2019-07-15 ENCOUNTER — Encounter: Payer: Self-pay | Admitting: Occupational Therapy

## 2019-07-15 DIAGNOSIS — R278 Other lack of coordination: Secondary | ICD-10-CM

## 2019-07-15 DIAGNOSIS — M6281 Muscle weakness (generalized): Secondary | ICD-10-CM | POA: Diagnosis not present
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
## 2019-07-17 ENCOUNTER — Encounter: Payer: Self-pay | Admitting: Occupational Therapy

## 2019-07-17 ENCOUNTER — Other Ambulatory Visit: Payer: Self-pay

## 2019-07-17 ENCOUNTER — Ambulatory Visit: Payer: BC Managed Care – PPO | Admitting: Occupational Therapy

## 2019-07-17 DIAGNOSIS — R278 Other lack of coordination: Secondary | ICD-10-CM

## 2019-07-17 DIAGNOSIS — M6281 Muscle weakness (generalized): Secondary | ICD-10-CM | POA: Diagnosis not present
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
## 2019-07-24 ENCOUNTER — Ambulatory Visit: Payer: BC Managed Care – PPO | Attending: Physical Medicine and Rehabilitation | Admitting: Occupational Therapy

## 2019-07-24 ENCOUNTER — Other Ambulatory Visit: Payer: Self-pay

## 2019-07-24 ENCOUNTER — Encounter: Payer: Self-pay | Admitting: Occupational Therapy

## 2019-07-24 DIAGNOSIS — M6281 Muscle weakness (generalized): Secondary | ICD-10-CM | POA: Diagnosis present

## 2019-07-24 DIAGNOSIS — R278 Other lack of coordination: Secondary | ICD-10-CM | POA: Insufficient documentation
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
## 2019-07-29 ENCOUNTER — Ambulatory Visit: Payer: BC Managed Care – PPO | Admitting: Occupational Therapy

## 2019-07-29 ENCOUNTER — Ambulatory Visit: Payer: BC Managed Care – PPO | Attending: Internal Medicine

## 2019-07-29 ENCOUNTER — Encounter: Payer: Self-pay | Admitting: Occupational Therapy

## 2019-07-29 ENCOUNTER — Other Ambulatory Visit: Payer: Self-pay

## 2019-07-29 DIAGNOSIS — R278 Other lack of coordination: Secondary | ICD-10-CM

## 2019-07-29 DIAGNOSIS — M6281 Muscle weakness (generalized): Secondary | ICD-10-CM | POA: Diagnosis not present

## 2019-07-29 DIAGNOSIS — Z23 Encounter for immunization: Secondary | ICD-10-CM

## 2019-07-29 NOTE — Progress Notes (Signed)
   Covid-19 Vaccination Clinic  Name:  Edgar [name]    MRN: [id number] DOB: 2001-03-10  07/29/2019  Edgar [name] was observed post Covid-19 immunization for 15 minutes without incident. He was provided with Vaccine Information Sheet and instruction to access the V-Safe system.   Edgar [name] was instructed to call 911 with any severe reactions post vaccine: [name] Difficulty breathing  . Swelling of face and throat  . A fast heartbeat  . A bad rash all over body  . Dizziness and weakness   Immunizations Administered    Name Date Dose VIS Date Route   Pfizer COVID-19 Vaccine 07/29/2019  3:14 PM 0.3 mL 03/29/2019 Intramuscular   Manufacturer: [employer], [employer]   Lot: [id number]   NDC: [address]-2
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
## 2019-07-31 ENCOUNTER — Encounter: Payer: Self-pay | Admitting: Occupational Therapy

## 2019-07-31 ENCOUNTER — Ambulatory Visit: Payer: BC Managed Care – PPO | Admitting: Occupational Therapy

## 2019-07-31 ENCOUNTER — Other Ambulatory Visit: Payer: Self-pay

## 2019-07-31 DIAGNOSIS — R278 Other lack of coordination: Secondary | ICD-10-CM

## 2019-07-31 DIAGNOSIS — M6281 Muscle weakness (generalized): Secondary | ICD-10-CM | POA: Diagnosis not present
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
## 2019-08-05 ENCOUNTER — Ambulatory Visit: Payer: BC Managed Care – PPO | Admitting: Occupational Therapy

## 2019-08-05 ENCOUNTER — Other Ambulatory Visit: Payer: Self-pay

## 2019-08-05 ENCOUNTER — Encounter: Payer: Self-pay | Admitting: Occupational Therapy

## 2019-08-05 DIAGNOSIS — M6281 Muscle weakness (generalized): Secondary | ICD-10-CM | POA: Diagnosis not present

## 2019-08-05 DIAGNOSIS — R278 Other lack of coordination: Secondary | ICD-10-CM
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
## 2019-08-06 ENCOUNTER — Emergency Department: Payer: BC Managed Care – PPO

## 2019-08-06 ENCOUNTER — Encounter: Payer: Self-pay | Admitting: Emergency Medicine

## 2019-08-06 ENCOUNTER — Emergency Department
Admission: EM | Admit: 2019-08-06 | Discharge: 2019-08-06 | Disposition: A | Discharge status: Home / Self Care | Payer: BC Managed Care – PPO | Attending: Emergency Medicine | Admitting: Emergency Medicine

## 2019-08-06 DIAGNOSIS — R110 Nausea: Secondary | ICD-10-CM | POA: Diagnosis not present

## 2019-08-06 DIAGNOSIS — Z982 Presence of cerebrospinal fluid drainage device: Secondary | ICD-10-CM | POA: Insufficient documentation

## 2019-08-06 DIAGNOSIS — Y929 Unspecified place or not applicable: Secondary | ICD-10-CM | POA: Insufficient documentation

## 2019-08-06 DIAGNOSIS — S0990XA Unspecified injury of head, initial encounter: Secondary | ICD-10-CM | POA: Insufficient documentation

## 2019-08-06 DIAGNOSIS — W109XXA Fall (on) (from) unspecified stairs and steps, initial encounter: Secondary | ICD-10-CM | POA: Diagnosis not present

## 2019-08-06 DIAGNOSIS — W19XXXA Unspecified fall, initial encounter: Secondary | ICD-10-CM

## 2019-08-06 DIAGNOSIS — Y999 Unspecified external cause status: Secondary | ICD-10-CM | POA: Diagnosis not present

## 2019-08-06 DIAGNOSIS — Z7982 Long term (current) use of aspirin: Secondary | ICD-10-CM | POA: Insufficient documentation

## 2019-08-06 DIAGNOSIS — Y9301 Activity, walking, marching and hiking: Secondary | ICD-10-CM | POA: Diagnosis not present

## 2019-08-06 DIAGNOSIS — Z87820 Personal history of traumatic brain injury: Secondary | ICD-10-CM | POA: Insufficient documentation

## 2019-08-06 DIAGNOSIS — T85618A Breakdown (mechanical) of other specified internal prosthetic devices, implants and grafts, initial encounter: Secondary | ICD-10-CM

## 2019-08-06 NOTE — ED Notes (Signed)
Md at bedside

## 2019-08-06 NOTE — ED Provider Notes (Signed)
[facility] Emergency Department Provider Note   ____________________________________________   First MD Initiated Contact with Patient 08/06/19 1646     (approximate)  I have reviewed the triage vital signs and the nursing notes.   HISTORY  Chief Complaint Head Injury    HPI Edgar [name] is a 19 y.o. male with past medical history of TBI status post VP shunt who presents to the ED following head injury.  Patient states that he was walking up the stairs just prior to arrival when he tripped and fell, striking his face on the wall.  He did not lose consciousness but had immediate onset of headache and nausea.  He also felt a sensation like water dripping down across the front of his face.  He denies any vision changes, numbness, or weakness, and states that both headache and nausea have improved.  The sensation of water dripping down his face has also resolved.  He has some weakness to his right upper extremity at baseline that is unchanged.  He had his VP shunt placed in 2017 at [facility] and has been doing well since then.  Mother states that in the past his right pupil has been slightly larger than the left, but it appears even larger than usual today.        Past Medical History:  Diagnosis Date  . Traumatic brain injury ([facility]) 2017    There are no problems to display for this patient.   Past Surgical History:  Procedure Laterality Date  . CSF SHUNT  2017  . wisdom tooth removal      Prior to Admission medications   Medication Sig Start Date End Date Taking? Authorizing Provider  aspirin EC 81 MG tablet Take 81 mg by mouth daily.    [provider]    Allergies Patient has no known allergies.  No family history on file.  Social History Social History   Tobacco Use  . Smoking status: Never Smoker  . Smokeless tobacco: Never Used  Substance Use Topics  . Alcohol use: No  . Drug use: No    Review of  Systems  Constitutional: No fever/chills Eyes: No visual changes. ENT: No sore throat. Cardiovascular: Denies chest pain. Respiratory: Denies shortness of breath. Gastrointestinal: No abdominal pain.  Positive for nausea, no vomiting.  No diarrhea.  No constipation. Genitourinary: Negative for dysuria. Musculoskeletal: Negative for back pain. Skin: Negative for rash. Neurological: Positive for headaches, negative for focal weakness or numbness.  ____________________________________________   PHYSICAL EXAM:  VITAL SIGNS: ED Triage Vitals [08/06/19 1423]  Enc Vitals Group     BP 126/68     Pulse Rate 72     Resp 19     Temp 98.6 F (37 C)     Temp Source Oral     SpO2 98 %     Weight 240 lb (108.9 kg)     Height 6\' 3"  (1.905 m)     Head Circumference      Peak Flow      Pain Score      Pain Loc      Pain Edu?      Excl. in [place]?     Constitutional: Alert and oriented. Eyes: Conjunctivae are normal.  Right pupil enlarged compared to left, both are reactive to light.  Extraocular movements intact. Head: Atraumatic. Nose: No congestion/rhinnorhea. Mouth/Throat: Mucous membranes are moist. Neck: Normal ROM Cardiovascular: Normal rate, regular rhythm. Grossly normal heart sounds. Respiratory: Normal respiratory  effort.  No retractions. Lungs CTAB. Gastrointestinal: Soft and nontender. No distention. Genitourinary: deferred Musculoskeletal: No lower extremity tenderness nor edema. Neurologic:  Normal speech and language.  4 out of 5 strength in right upper extremity, otherwise 5 out of 5 strength in left upper extremity and bilateral lower extremities, at patient's reported baseline. Skin:  Skin is warm, dry and intact. No rash noted. Psychiatric: Mood and affect are normal. Speech and behavior are normal.  ____________________________________________   LABS (all labs ordered are listed, but only abnormal results are displayed)  Labs Reviewed - No data to  display   PROCEDURES  Procedure(s) performed (including Critical Care):  Procedures   ____________________________________________   INITIAL IMPRESSION / ASSESSMENT AND PLAN / ED COURSE       19 year old male with history of TBI status post VP shunt at [facility] in 2017 presents to the ED for fall where he struck his head on the wall, subsequently had some headache and nausea as well as a dripping water sensation over his face.  Symptoms have since resolved, although mother noticed his right pupil appears larger than the left.  He is otherwise neurologically intact to his baseline.  CT head shows moderate hydrocephalus of unclear chronicity given this is his first head CT in our system.  Shunt series does show possible kink in patient's upper abdomen, we will further assess with lateral abdominal x-ray.  Case was discussed with Dr. Adriana Simas of neurosurgery, who will review patient's imaging.  Dr. Adriana Simas has reviewed imaging and states that hydrocephalus appears chronic, especially given patient's reassuring neurologic exam.  Lateral abdominal x-ray shows no evidence of shunt abnormality.  Given right pupil is reactive to light, this is likely a chronic finding.  Patient remains asymptomatic here in the ED and per Dr. Adriana Simas is appropriate for discharge home with neurosurgery follow-up tomorrow.  Patient and mother counseled to return to the ED for any new or worsening symptoms, they agree with plan.      ____________________________________________   FINAL CLINICAL IMPRESSION(S) / ED DIAGNOSES  Final diagnoses:  Fall, initial encounter  S/P VP shunt     ED Discharge Orders    None       Note:  This document was prepared using Dragon voice recognition software and may include unintentional dictation errors.   Chesley Noon, MD 08/06/19 [phone]

## 2019-08-06 NOTE — ED Notes (Signed)
This RN discussed patient's CT results with Dr. Mayford Knife.  Dr. Mayford Knife acknowledged and stated that patient should be safe to continue to wait in the waiting room.

## 2019-08-06 NOTE — ED Triage Notes (Signed)
Patient presents to the ED after tripping on a top stair and hit his head on a wall.  Patient has history of a TBI in 2017 and has a shunt.  Patient reports a sensation of feeling like there is water dripping down his face.  Patient's mother reports patient seems more, "out of it" since the injury at 12:30pm.

## 2019-08-07 ENCOUNTER — Ambulatory Visit: Payer: BC Managed Care – PPO | Admitting: Occupational Therapy

## 2019-08-12 ENCOUNTER — Other Ambulatory Visit: Payer: Self-pay

## 2019-08-12 ENCOUNTER — Ambulatory Visit: Payer: BC Managed Care – PPO | Admitting: Occupational Therapy

## 2019-08-12 ENCOUNTER — Encounter: Payer: Self-pay | Admitting: Occupational Therapy

## 2019-08-12 DIAGNOSIS — M6281 Muscle weakness (generalized): Secondary | ICD-10-CM

## 2019-08-12 DIAGNOSIS — R278 Other lack of coordination: Secondary | ICD-10-CM
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
## 2019-08-14 ENCOUNTER — Ambulatory Visit: Payer: BC Managed Care – PPO | Admitting: Occupational Therapy

## 2019-08-14 ENCOUNTER — Other Ambulatory Visit: Payer: Self-pay

## 2019-08-14 DIAGNOSIS — M6281 Muscle weakness (generalized): Secondary | ICD-10-CM

## 2019-08-15 ENCOUNTER — Encounter: Payer: Self-pay | Admitting: Occupational Therapy
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
## 2019-08-19 ENCOUNTER — Ambulatory Visit: Payer: BC Managed Care – PPO | Attending: Physical Medicine and Rehabilitation | Admitting: Occupational Therapy

## 2019-08-19 ENCOUNTER — Other Ambulatory Visit: Payer: Self-pay

## 2019-08-19 ENCOUNTER — Encounter: Payer: Self-pay | Admitting: Occupational Therapy

## 2019-08-19 DIAGNOSIS — M6281 Muscle weakness (generalized): Secondary | ICD-10-CM | POA: Diagnosis not present

## 2019-08-19 DIAGNOSIS — R278 Other lack of coordination: Secondary | ICD-10-CM | POA: Diagnosis present
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
## 2019-08-21 ENCOUNTER — Ambulatory Visit: Payer: BC Managed Care – PPO | Admitting: Occupational Therapy

## 2019-08-21 ENCOUNTER — Other Ambulatory Visit: Payer: Self-pay

## 2019-08-21 ENCOUNTER — Encounter: Payer: Self-pay | Admitting: Occupational Therapy

## 2019-08-21 DIAGNOSIS — M6281 Muscle weakness (generalized): Secondary | ICD-10-CM | POA: Diagnosis not present
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
## 2019-08-26 ENCOUNTER — Ambulatory Visit: Payer: BC Managed Care – PPO | Admitting: Occupational Therapy

## 2019-08-26 ENCOUNTER — Encounter: Payer: Self-pay | Admitting: Occupational Therapy

## 2019-08-26 ENCOUNTER — Other Ambulatory Visit: Payer: Self-pay

## 2019-08-26 DIAGNOSIS — R278 Other lack of coordination: Secondary | ICD-10-CM

## 2019-08-26 DIAGNOSIS — M6281 Muscle weakness (generalized): Secondary | ICD-10-CM | POA: Diagnosis not present
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
## 2019-08-28 ENCOUNTER — Ambulatory Visit: Payer: BC Managed Care – PPO | Admitting: Occupational Therapy

## 2019-08-28 ENCOUNTER — Other Ambulatory Visit: Payer: Self-pay

## 2019-08-28 ENCOUNTER — Encounter: Payer: Self-pay | Admitting: Occupational Therapy

## 2019-08-28 DIAGNOSIS — R278 Other lack of coordination: Secondary | ICD-10-CM

## 2019-08-28 DIAGNOSIS — M6281 Muscle weakness (generalized): Secondary | ICD-10-CM
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
## 2019-09-02 ENCOUNTER — Other Ambulatory Visit: Payer: Self-pay

## 2019-09-02 ENCOUNTER — Ambulatory Visit: Payer: BC Managed Care – PPO | Admitting: Occupational Therapy

## 2019-09-02 ENCOUNTER — Encounter: Payer: Self-pay | Admitting: Occupational Therapy

## 2019-09-02 DIAGNOSIS — M6281 Muscle weakness (generalized): Secondary | ICD-10-CM
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
## 2019-09-04 ENCOUNTER — Ambulatory Visit: Payer: BC Managed Care – PPO | Admitting: Occupational Therapy

## 2019-09-04 ENCOUNTER — Encounter: Payer: Self-pay | Admitting: Occupational Therapy

## 2019-09-04 ENCOUNTER — Other Ambulatory Visit: Payer: Self-pay

## 2019-09-04 DIAGNOSIS — M6281 Muscle weakness (generalized): Secondary | ICD-10-CM | POA: Diagnosis not present

## 2019-09-04 DIAGNOSIS — R278 Other lack of coordination: Secondary | ICD-10-CM
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
## 2019-09-09 ENCOUNTER — Encounter: Payer: Self-pay | Admitting: Occupational Therapy

## 2019-09-09 ENCOUNTER — Other Ambulatory Visit: Payer: Self-pay

## 2019-09-09 ENCOUNTER — Ambulatory Visit: Payer: BC Managed Care – PPO | Admitting: Occupational Therapy

## 2019-09-09 DIAGNOSIS — R278 Other lack of coordination: Secondary | ICD-10-CM

## 2019-09-09 DIAGNOSIS — M6281 Muscle weakness (generalized): Secondary | ICD-10-CM
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
## 2019-09-11 ENCOUNTER — Encounter: Payer: Self-pay | Admitting: Occupational Therapy

## 2019-09-11 ENCOUNTER — Other Ambulatory Visit: Payer: Self-pay

## 2019-09-11 ENCOUNTER — Ambulatory Visit: Payer: BC Managed Care – PPO | Admitting: Occupational Therapy

## 2019-09-11 DIAGNOSIS — M6281 Muscle weakness (generalized): Secondary | ICD-10-CM

## 2019-09-11 DIAGNOSIS — R278 Other lack of coordination: Secondary | ICD-10-CM
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
## 2019-09-18 ENCOUNTER — Other Ambulatory Visit: Payer: Self-pay

## 2019-09-18 ENCOUNTER — Ambulatory Visit: Payer: BC Managed Care – PPO | Admitting: Occupational Therapy

## 2019-09-23 ENCOUNTER — Ambulatory Visit: Payer: BC Managed Care – PPO | Attending: Physical Medicine and Rehabilitation | Admitting: Occupational Therapy

## 2019-09-23 ENCOUNTER — Other Ambulatory Visit: Payer: Self-pay

## 2019-09-23 DIAGNOSIS — M6281 Muscle weakness (generalized): Secondary | ICD-10-CM | POA: Insufficient documentation

## 2019-09-23 DIAGNOSIS — R278 Other lack of coordination: Secondary | ICD-10-CM | POA: Insufficient documentation

## 2019-09-23 DIAGNOSIS — R2681 Unsteadiness on feet: Secondary | ICD-10-CM | POA: Diagnosis present

## 2019-09-24 ENCOUNTER — Encounter: Payer: Self-pay | Admitting: Occupational Therapy
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
## 2019-09-25 ENCOUNTER — Encounter: Payer: Self-pay | Admitting: Occupational Therapy

## 2019-09-25 ENCOUNTER — Ambulatory Visit: Payer: BC Managed Care – PPO | Admitting: Occupational Therapy

## 2019-09-25 ENCOUNTER — Other Ambulatory Visit: Payer: Self-pay

## 2019-09-25 DIAGNOSIS — R278 Other lack of coordination: Secondary | ICD-10-CM

## 2019-09-25 DIAGNOSIS — M6281 Muscle weakness (generalized): Secondary | ICD-10-CM | POA: Diagnosis not present
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
## 2019-09-30 ENCOUNTER — Encounter: Payer: Self-pay | Admitting: Occupational Therapy

## 2019-09-30 ENCOUNTER — Other Ambulatory Visit: Payer: Self-pay

## 2019-09-30 ENCOUNTER — Ambulatory Visit: Payer: BC Managed Care – PPO | Admitting: Occupational Therapy

## 2019-09-30 DIAGNOSIS — R2681 Unsteadiness on feet: Secondary | ICD-10-CM

## 2019-09-30 DIAGNOSIS — M6281 Muscle weakness (generalized): Secondary | ICD-10-CM

## 2019-09-30 DIAGNOSIS — R278 Other lack of coordination: Secondary | ICD-10-CM
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
## 2019-10-02 ENCOUNTER — Encounter: Payer: Self-pay | Admitting: Occupational Therapy

## 2019-10-02 ENCOUNTER — Other Ambulatory Visit: Payer: Self-pay

## 2019-10-02 ENCOUNTER — Ambulatory Visit: Payer: BC Managed Care – PPO | Admitting: Occupational Therapy

## 2019-10-02 DIAGNOSIS — M6281 Muscle weakness (generalized): Secondary | ICD-10-CM | POA: Diagnosis not present

## 2019-10-02 DIAGNOSIS — R2681 Unsteadiness on feet: Secondary | ICD-10-CM

## 2019-10-02 DIAGNOSIS — R278 Other lack of coordination: Secondary | ICD-10-CM
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
## 2019-10-07 ENCOUNTER — Other Ambulatory Visit: Payer: Self-pay

## 2019-10-07 ENCOUNTER — Encounter: Payer: Self-pay | Admitting: Occupational Therapy

## 2019-10-07 ENCOUNTER — Ambulatory Visit: Payer: BC Managed Care – PPO | Admitting: Occupational Therapy

## 2019-10-07 DIAGNOSIS — M6281 Muscle weakness (generalized): Secondary | ICD-10-CM

## 2019-10-07 DIAGNOSIS — R278 Other lack of coordination: Secondary | ICD-10-CM

## 2019-10-09 ENCOUNTER — Encounter: Payer: Self-pay | Admitting: Occupational Therapy

## 2019-10-09 ENCOUNTER — Other Ambulatory Visit: Payer: Self-pay

## 2019-10-09 ENCOUNTER — Ambulatory Visit: Payer: BC Managed Care – PPO | Admitting: Occupational Therapy

## 2019-10-09 DIAGNOSIS — M6281 Muscle weakness (generalized): Secondary | ICD-10-CM

## 2019-10-09 DIAGNOSIS — R278 Other lack of coordination: Secondary | ICD-10-CM
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
## 2019-10-14 ENCOUNTER — Other Ambulatory Visit: Payer: Self-pay

## 2019-10-14 ENCOUNTER — Ambulatory Visit: Payer: BC Managed Care – PPO | Admitting: Occupational Therapy

## 2019-10-14 ENCOUNTER — Encounter: Payer: Self-pay | Admitting: Occupational Therapy

## 2019-10-14 DIAGNOSIS — R278 Other lack of coordination: Secondary | ICD-10-CM

## 2019-10-14 DIAGNOSIS — M6281 Muscle weakness (generalized): Secondary | ICD-10-CM
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
## 2019-10-16 ENCOUNTER — Other Ambulatory Visit: Payer: Self-pay

## 2019-10-16 ENCOUNTER — Ambulatory Visit: Payer: BC Managed Care – PPO | Admitting: Occupational Therapy

## 2019-10-16 DIAGNOSIS — M6281 Muscle weakness (generalized): Secondary | ICD-10-CM

## 2019-10-16 DIAGNOSIS — R278 Other lack of coordination: Secondary | ICD-10-CM
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
## 2019-10-23 ENCOUNTER — Encounter: Payer: Self-pay | Admitting: Occupational Therapy

## 2019-10-23 ENCOUNTER — Other Ambulatory Visit: Payer: Self-pay

## 2019-10-23 ENCOUNTER — Ambulatory Visit: Payer: BC Managed Care – PPO | Attending: Physical Medicine and Rehabilitation | Admitting: Occupational Therapy

## 2019-10-23 DIAGNOSIS — R278 Other lack of coordination: Secondary | ICD-10-CM | POA: Insufficient documentation

## 2019-10-23 DIAGNOSIS — M6281 Muscle weakness (generalized): Secondary | ICD-10-CM | POA: Insufficient documentation
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
## 2019-10-28 ENCOUNTER — Ambulatory Visit: Payer: BC Managed Care – PPO | Admitting: Occupational Therapy

## 2019-10-30 ENCOUNTER — Ambulatory Visit: Payer: BC Managed Care – PPO | Admitting: Occupational Therapy

## 2019-11-04 ENCOUNTER — Other Ambulatory Visit: Payer: Self-pay

## 2019-11-04 ENCOUNTER — Ambulatory Visit: Payer: BC Managed Care – PPO | Admitting: Occupational Therapy

## 2019-11-04 DIAGNOSIS — M6281 Muscle weakness (generalized): Secondary | ICD-10-CM | POA: Diagnosis not present

## 2019-11-04 DIAGNOSIS — R278 Other lack of coordination: Secondary | ICD-10-CM

## 2019-11-05 ENCOUNTER — Encounter: Payer: Self-pay | Admitting: Occupational Therapy
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
## 2019-11-06 ENCOUNTER — Ambulatory Visit: Payer: BC Managed Care – PPO | Admitting: Occupational Therapy

## 2019-11-06 ENCOUNTER — Other Ambulatory Visit: Payer: Self-pay

## 2019-11-06 DIAGNOSIS — M6281 Muscle weakness (generalized): Secondary | ICD-10-CM

## 2019-11-06 DIAGNOSIS — R278 Other lack of coordination: Secondary | ICD-10-CM
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
## 2019-11-11 ENCOUNTER — Other Ambulatory Visit: Payer: Self-pay

## 2019-11-11 ENCOUNTER — Ambulatory Visit: Payer: BC Managed Care – PPO | Admitting: Occupational Therapy

## 2019-11-11 ENCOUNTER — Encounter: Payer: Self-pay | Admitting: Occupational Therapy

## 2019-11-11 DIAGNOSIS — M6281 Muscle weakness (generalized): Secondary | ICD-10-CM | POA: Diagnosis not present

## 2019-11-11 DIAGNOSIS — R278 Other lack of coordination: Secondary | ICD-10-CM
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
## 2019-11-13 ENCOUNTER — Other Ambulatory Visit: Payer: Self-pay

## 2019-11-13 ENCOUNTER — Ambulatory Visit: Payer: BC Managed Care – PPO | Admitting: Occupational Therapy

## 2019-11-13 ENCOUNTER — Encounter: Payer: Self-pay | Admitting: Occupational Therapy

## 2019-11-13 DIAGNOSIS — M6281 Muscle weakness (generalized): Secondary | ICD-10-CM | POA: Diagnosis not present

## 2019-11-13 DIAGNOSIS — R278 Other lack of coordination: Secondary | ICD-10-CM
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
## 2019-11-18 ENCOUNTER — Encounter: Payer: Self-pay | Admitting: Emergency Medicine

## 2019-11-18 ENCOUNTER — Ambulatory Visit: Payer: BC Managed Care – PPO | Admitting: Occupational Therapy

## 2019-11-18 ENCOUNTER — Other Ambulatory Visit: Payer: Self-pay

## 2019-11-18 ENCOUNTER — Emergency Department
Admission: EM | Admit: 2019-11-18 | Discharge: 2019-11-18 | Disposition: A | Discharge status: Home / Self Care | Payer: BC Managed Care – PPO | Attending: Emergency Medicine | Admitting: Emergency Medicine

## 2019-11-18 ENCOUNTER — Emergency Department: Payer: BC Managed Care – PPO

## 2019-11-18 DIAGNOSIS — Y939 Activity, unspecified: Secondary | ICD-10-CM | POA: Insufficient documentation

## 2019-11-18 DIAGNOSIS — Y92410 Unspecified street and highway as the place of occurrence of the external cause: Secondary | ICD-10-CM | POA: Insufficient documentation

## 2019-11-18 DIAGNOSIS — Z7982 Long term (current) use of aspirin: Secondary | ICD-10-CM | POA: Diagnosis not present

## 2019-11-18 DIAGNOSIS — R079 Chest pain, unspecified: Secondary | ICD-10-CM | POA: Diagnosis present

## 2019-11-18 DIAGNOSIS — R52 Pain, unspecified: Secondary | ICD-10-CM

## 2019-11-18 DIAGNOSIS — Y999 Unspecified external cause status: Secondary | ICD-10-CM | POA: Diagnosis not present

## 2019-11-18 DIAGNOSIS — M25551 Pain in right hip: Secondary | ICD-10-CM | POA: Insufficient documentation

## 2019-11-18 LAB — BASIC METABOLIC PANEL
Anion gap: 8 (ref 5–15)
BUN: 13 mg/dL (ref 6–20)
CO2: 26 mmol/L (ref 22–32)
Calcium: 8.9 mg/dL (ref 8.9–10.3)
Chloride: 106 mmol/L (ref 98–111)
Creatinine, Ser: 0.95 mg/dL (ref 0.61–1.24)
GFR calc Af Amer: >60 mL/min (ref >60)
GFR calc non Af Amer: >60 mL/min (ref >60)
Glucose, Bld: 108 mg/dL — ABNORMAL HIGH (ref 70–99)
Potassium: 3.3 mmol/L — ABNORMAL LOW (ref 3.5–5.1)
Sodium: 140 mmol/L (ref 135–145)

## 2019-11-18 LAB — CBC
HCT: 43.1 % (ref 39.0–52.0)
Hemoglobin: 14.5 g/dL (ref 13.0–17.0)
MCH: 28.8 pg (ref 26.0–34.0)
MCHC: 33.6 g/dL (ref 30.0–36.0)
MCV: 85.5 fL (ref 80.0–100.0)
Platelets: 258 10*3/uL (ref 150–400)
RBC: 5.04 MIL/uL (ref 4.22–5.81)
RDW: 13.2 % (ref 11.5–15.5)
WBC: 16.5 10*3/uL — ABNORMAL HIGH (ref 4.0–10.5)
nRBC: 0 % (ref 0.0–0.2)

## 2019-11-18 MED ORDER — BACLOFEN 5 MG PO TABS: 5.0000 mg | ORAL_TABLET | Freq: Three times a day (TID) | ORAL | 0 refills | Status: DC | PRN

## 2019-11-18 MED ORDER — IOHEXOL 300 MG/ML  SOLN
150.0000 mL | Freq: Once | INTRAMUSCULAR | Status: AC | PRN
  Administered 2019-11-18: 150 mL via INTRAVENOUS
  Filled 2019-11-18: qty 150
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
## 2019-11-20 ENCOUNTER — Ambulatory Visit: Payer: BC Managed Care – PPO | Attending: Physical Medicine and Rehabilitation | Admitting: Occupational Therapy

## 2019-11-20 ENCOUNTER — Other Ambulatory Visit: Payer: Self-pay

## 2019-11-20 DIAGNOSIS — M25551 Pain in right hip: Secondary | ICD-10-CM | POA: Diagnosis present

## 2019-11-20 DIAGNOSIS — M6281 Muscle weakness (generalized): Secondary | ICD-10-CM | POA: Diagnosis not present

## 2019-11-20 DIAGNOSIS — R278 Other lack of coordination: Secondary | ICD-10-CM | POA: Diagnosis present

## 2019-11-20 DIAGNOSIS — R2681 Unsteadiness on feet: Secondary | ICD-10-CM | POA: Diagnosis present

## 2019-11-22 ENCOUNTER — Encounter: Payer: Self-pay | Admitting: Occupational Therapy
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
## 2019-11-25 ENCOUNTER — Encounter: Payer: Self-pay | Admitting: Occupational Therapy

## 2019-11-25 ENCOUNTER — Ambulatory Visit: Payer: BC Managed Care – PPO | Admitting: Occupational Therapy

## 2019-11-25 ENCOUNTER — Other Ambulatory Visit: Payer: Self-pay

## 2019-11-25 DIAGNOSIS — R278 Other lack of coordination: Secondary | ICD-10-CM

## 2019-11-25 DIAGNOSIS — M6281 Muscle weakness (generalized): Secondary | ICD-10-CM
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
## 2019-11-27 ENCOUNTER — Ambulatory Visit: Payer: BC Managed Care – PPO | Admitting: Occupational Therapy

## 2019-11-28 ENCOUNTER — Ambulatory Visit
Admission: RE | Admit: 2019-11-28 | Disposition: A | Discharge status: Home / Self Care | Payer: BC Managed Care – PPO | Source: Home / Self Care | Attending: Physician Assistant | Admitting: Physician Assistant

## 2019-11-28 ENCOUNTER — Other Ambulatory Visit: Payer: Self-pay | Admitting: Physician Assistant

## 2019-11-28 ENCOUNTER — Other Ambulatory Visit: Payer: Self-pay

## 2019-11-28 DIAGNOSIS — M5410 Radiculopathy, site unspecified: Secondary | ICD-10-CM

## 2019-11-29 ENCOUNTER — Ambulatory Visit
Admission: RE | Admit: 2019-11-29 | Discharge: 2019-11-29 | Disposition: A | Discharge status: Home / Self Care | Payer: BC Managed Care – PPO | Attending: Physician Assistant | Admitting: Physician Assistant

## 2019-11-29 ENCOUNTER — Ambulatory Visit
Admission: RE | Admit: 2019-11-29 | Discharge: 2019-11-29 | Disposition: A | Discharge status: Home / Self Care | Payer: BC Managed Care – PPO | Source: Ambulatory Visit | Attending: Physician Assistant | Admitting: Physician Assistant

## 2019-11-29 DIAGNOSIS — M5410 Radiculopathy, site unspecified: Secondary | ICD-10-CM | POA: Diagnosis not present

## 2019-11-29 DIAGNOSIS — M549 Dorsalgia, unspecified: Secondary | ICD-10-CM | POA: Diagnosis present

## 2019-12-02 ENCOUNTER — Encounter: Payer: Self-pay | Admitting: Occupational Therapy

## 2019-12-02 ENCOUNTER — Other Ambulatory Visit: Payer: Self-pay

## 2019-12-02 ENCOUNTER — Ambulatory Visit: Payer: BC Managed Care – PPO | Admitting: Occupational Therapy

## 2019-12-02 DIAGNOSIS — R278 Other lack of coordination: Secondary | ICD-10-CM

## 2019-12-02 DIAGNOSIS — M6281 Muscle weakness (generalized): Secondary | ICD-10-CM
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
## 2019-12-04 ENCOUNTER — Ambulatory Visit: Payer: BC Managed Care – PPO

## 2019-12-04 ENCOUNTER — Other Ambulatory Visit: Payer: Self-pay

## 2019-12-04 ENCOUNTER — Ambulatory Visit: Payer: BC Managed Care – PPO | Admitting: Occupational Therapy

## 2019-12-04 DIAGNOSIS — M6281 Muscle weakness (generalized): Secondary | ICD-10-CM | POA: Diagnosis not present

## 2019-12-04 DIAGNOSIS — R278 Other lack of coordination: Secondary | ICD-10-CM

## 2019-12-04 DIAGNOSIS — M25551 Pain in right hip: Secondary | ICD-10-CM
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
## 2019-12-05 ENCOUNTER — Encounter: Payer: Self-pay | Admitting: Occupational Therapy
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
## 2019-12-09 ENCOUNTER — Encounter: Payer: Self-pay | Admitting: Occupational Therapy

## 2019-12-09 ENCOUNTER — Other Ambulatory Visit: Payer: Self-pay

## 2019-12-09 ENCOUNTER — Ambulatory Visit: Payer: BC Managed Care – PPO | Admitting: Occupational Therapy

## 2019-12-09 DIAGNOSIS — R278 Other lack of coordination: Secondary | ICD-10-CM

## 2019-12-09 DIAGNOSIS — M6281 Muscle weakness (generalized): Secondary | ICD-10-CM
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
## 2019-12-11 ENCOUNTER — Ambulatory Visit: Payer: BC Managed Care – PPO | Admitting: Occupational Therapy

## 2019-12-11 ENCOUNTER — Ambulatory Visit: Payer: BC Managed Care – PPO

## 2019-12-11 ENCOUNTER — Other Ambulatory Visit: Payer: Self-pay

## 2019-12-11 DIAGNOSIS — R278 Other lack of coordination: Secondary | ICD-10-CM

## 2019-12-11 DIAGNOSIS — M25551 Pain in right hip: Secondary | ICD-10-CM

## 2019-12-11 DIAGNOSIS — M6281 Muscle weakness (generalized): Secondary | ICD-10-CM
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
## 2019-12-12 ENCOUNTER — Encounter: Payer: Self-pay | Admitting: Occupational Therapy
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
## 2019-12-16 ENCOUNTER — Ambulatory Visit: Payer: BC Managed Care – PPO | Admitting: Occupational Therapy

## 2019-12-16 ENCOUNTER — Encounter: Payer: Self-pay | Admitting: Occupational Therapy

## 2019-12-16 ENCOUNTER — Other Ambulatory Visit: Payer: Self-pay

## 2019-12-16 DIAGNOSIS — M6281 Muscle weakness (generalized): Secondary | ICD-10-CM

## 2019-12-16 DIAGNOSIS — R278 Other lack of coordination: Secondary | ICD-10-CM
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
## 2019-12-18 ENCOUNTER — Other Ambulatory Visit: Payer: Self-pay

## 2019-12-18 ENCOUNTER — Ambulatory Visit: Payer: BC Managed Care – PPO | Attending: Physical Medicine and Rehabilitation | Admitting: Occupational Therapy

## 2019-12-18 ENCOUNTER — Ambulatory Visit: Payer: BC Managed Care – PPO

## 2019-12-18 DIAGNOSIS — M25551 Pain in right hip: Secondary | ICD-10-CM

## 2019-12-18 DIAGNOSIS — R2681 Unsteadiness on feet: Secondary | ICD-10-CM | POA: Diagnosis present

## 2019-12-18 DIAGNOSIS — R278 Other lack of coordination: Secondary | ICD-10-CM | POA: Insufficient documentation

## 2019-12-18 DIAGNOSIS — M6281 Muscle weakness (generalized): Secondary | ICD-10-CM | POA: Diagnosis present
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
## 2019-12-19 ENCOUNTER — Encounter: Payer: Self-pay | Admitting: Occupational Therapy
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
## 2019-12-24 ENCOUNTER — Ambulatory Visit: Payer: BC Managed Care – PPO

## 2019-12-24 ENCOUNTER — Other Ambulatory Visit: Payer: Self-pay

## 2019-12-24 DIAGNOSIS — M25551 Pain in right hip: Secondary | ICD-10-CM

## 2019-12-24 DIAGNOSIS — R278 Other lack of coordination: Secondary | ICD-10-CM

## 2019-12-24 DIAGNOSIS — M6281 Muscle weakness (generalized): Secondary | ICD-10-CM | POA: Diagnosis not present

## 2019-12-24 DIAGNOSIS — R2681 Unsteadiness on feet: Secondary | ICD-10-CM
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
## 2019-12-25 ENCOUNTER — Ambulatory Visit: Payer: BC Managed Care – PPO | Admitting: Occupational Therapy

## 2019-12-25 ENCOUNTER — Ambulatory Visit: Payer: BC Managed Care – PPO

## 2019-12-25 ENCOUNTER — Other Ambulatory Visit: Payer: Self-pay

## 2019-12-25 DIAGNOSIS — M25551 Pain in right hip: Secondary | ICD-10-CM

## 2019-12-25 DIAGNOSIS — M6281 Muscle weakness (generalized): Secondary | ICD-10-CM
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
## 2019-12-26 ENCOUNTER — Encounter: Payer: Self-pay | Admitting: Occupational Therapy
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
## 2019-12-30 ENCOUNTER — Ambulatory Visit: Payer: BC Managed Care – PPO | Admitting: Occupational Therapy

## 2019-12-30 ENCOUNTER — Encounter: Payer: Self-pay | Admitting: Occupational Therapy

## 2019-12-30 ENCOUNTER — Other Ambulatory Visit: Payer: Self-pay

## 2019-12-30 DIAGNOSIS — M6281 Muscle weakness (generalized): Secondary | ICD-10-CM

## 2019-12-30 DIAGNOSIS — R278 Other lack of coordination: Secondary | ICD-10-CM
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
## 2020-01-01 ENCOUNTER — Encounter: Payer: Self-pay | Admitting: Occupational Therapy

## 2020-01-01 ENCOUNTER — Ambulatory Visit: Payer: BC Managed Care – PPO | Admitting: Occupational Therapy

## 2020-01-01 ENCOUNTER — Other Ambulatory Visit: Payer: Self-pay

## 2020-01-01 DIAGNOSIS — R278 Other lack of coordination: Secondary | ICD-10-CM

## 2020-01-01 DIAGNOSIS — M6281 Muscle weakness (generalized): Secondary | ICD-10-CM
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
## 2020-01-06 ENCOUNTER — Ambulatory Visit: Payer: BC Managed Care – PPO | Admitting: Occupational Therapy

## 2020-01-06 ENCOUNTER — Encounter: Payer: Self-pay | Admitting: Occupational Therapy

## 2020-01-06 ENCOUNTER — Other Ambulatory Visit: Payer: Self-pay

## 2020-01-06 DIAGNOSIS — M6281 Muscle weakness (generalized): Secondary | ICD-10-CM | POA: Diagnosis not present

## 2020-01-06 DIAGNOSIS — R278 Other lack of coordination: Secondary | ICD-10-CM
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
## 2020-01-08 ENCOUNTER — Encounter: Payer: Self-pay | Admitting: Occupational Therapy

## 2020-01-08 ENCOUNTER — Ambulatory Visit: Payer: BC Managed Care – PPO | Admitting: Occupational Therapy

## 2020-01-08 ENCOUNTER — Other Ambulatory Visit: Payer: Self-pay

## 2020-01-08 DIAGNOSIS — M6281 Muscle weakness (generalized): Secondary | ICD-10-CM | POA: Diagnosis not present
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
## 2020-01-09 ENCOUNTER — Encounter: Payer: BC Managed Care – PPO | Admitting: Physical Therapy

## 2020-01-09 ENCOUNTER — Ambulatory Visit: Payer: BC Managed Care – PPO | Admitting: Physical Therapy

## 2020-01-13 ENCOUNTER — Encounter: Payer: Self-pay | Admitting: Occupational Therapy

## 2020-01-13 ENCOUNTER — Ambulatory Visit: Payer: BC Managed Care – PPO | Admitting: Occupational Therapy

## 2020-01-13 ENCOUNTER — Other Ambulatory Visit: Payer: Self-pay

## 2020-01-13 DIAGNOSIS — R278 Other lack of coordination: Secondary | ICD-10-CM

## 2020-01-13 DIAGNOSIS — M6281 Muscle weakness (generalized): Secondary | ICD-10-CM | POA: Diagnosis not present
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
## 2020-01-15 ENCOUNTER — Encounter: Payer: Self-pay | Admitting: Occupational Therapy

## 2020-01-15 ENCOUNTER — Other Ambulatory Visit: Payer: Self-pay

## 2020-01-15 ENCOUNTER — Ambulatory Visit: Payer: BC Managed Care – PPO | Admitting: Occupational Therapy

## 2020-01-15 DIAGNOSIS — M6281 Muscle weakness (generalized): Secondary | ICD-10-CM | POA: Diagnosis not present
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
## 2020-01-20 ENCOUNTER — Encounter: Payer: Self-pay | Admitting: Occupational Therapy

## 2020-01-20 ENCOUNTER — Ambulatory Visit: Payer: BC Managed Care – PPO | Attending: Physical Medicine and Rehabilitation | Admitting: Occupational Therapy

## 2020-01-20 ENCOUNTER — Other Ambulatory Visit: Payer: Self-pay

## 2020-01-20 DIAGNOSIS — M6281 Muscle weakness (generalized): Secondary | ICD-10-CM | POA: Diagnosis not present

## 2020-01-20 DIAGNOSIS — R278 Other lack of coordination: Secondary | ICD-10-CM | POA: Diagnosis present
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
## 2020-01-22 ENCOUNTER — Other Ambulatory Visit: Payer: Self-pay

## 2020-01-22 ENCOUNTER — Ambulatory Visit: Payer: BC Managed Care – PPO | Admitting: Occupational Therapy

## 2020-01-22 ENCOUNTER — Encounter: Payer: Self-pay | Admitting: Occupational Therapy

## 2020-01-22 DIAGNOSIS — M6281 Muscle weakness (generalized): Secondary | ICD-10-CM

## 2020-01-22 DIAGNOSIS — R278 Other lack of coordination: Secondary | ICD-10-CM
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
## 2020-01-27 ENCOUNTER — Ambulatory Visit: Payer: BC Managed Care – PPO | Admitting: Occupational Therapy

## 2020-01-27 ENCOUNTER — Other Ambulatory Visit: Payer: Self-pay

## 2020-01-27 ENCOUNTER — Encounter: Payer: Self-pay | Admitting: Occupational Therapy

## 2020-01-27 DIAGNOSIS — M6281 Muscle weakness (generalized): Secondary | ICD-10-CM | POA: Diagnosis not present

## 2020-01-27 DIAGNOSIS — R278 Other lack of coordination: Secondary | ICD-10-CM

## 2020-01-27 NOTE — Addendum Note (Signed)
Addended by: Avon Gully on: 01/27/2020 09:15 AM   Modules accepted: Orders
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
## 2020-01-29 ENCOUNTER — Other Ambulatory Visit: Payer: Self-pay

## 2020-01-29 ENCOUNTER — Ambulatory Visit: Payer: BC Managed Care – PPO | Admitting: Occupational Therapy

## 2020-01-29 ENCOUNTER — Encounter: Payer: Self-pay | Admitting: Occupational Therapy

## 2020-01-29 DIAGNOSIS — R278 Other lack of coordination: Secondary | ICD-10-CM

## 2020-01-29 DIAGNOSIS — M6281 Muscle weakness (generalized): Secondary | ICD-10-CM
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
## 2020-02-03 ENCOUNTER — Encounter: Payer: Self-pay | Admitting: Occupational Therapy

## 2020-02-03 ENCOUNTER — Other Ambulatory Visit: Payer: Self-pay

## 2020-02-03 ENCOUNTER — Ambulatory Visit: Payer: BC Managed Care – PPO | Admitting: Occupational Therapy

## 2020-02-03 DIAGNOSIS — R278 Other lack of coordination: Secondary | ICD-10-CM

## 2020-02-03 DIAGNOSIS — M6281 Muscle weakness (generalized): Secondary | ICD-10-CM | POA: Diagnosis not present
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
## 2020-02-05 ENCOUNTER — Other Ambulatory Visit: Payer: Self-pay

## 2020-02-05 ENCOUNTER — Ambulatory Visit: Payer: BC Managed Care – PPO | Admitting: Occupational Therapy

## 2020-02-05 DIAGNOSIS — R278 Other lack of coordination: Secondary | ICD-10-CM

## 2020-02-05 DIAGNOSIS — M6281 Muscle weakness (generalized): Secondary | ICD-10-CM | POA: Diagnosis not present

## 2020-02-06 ENCOUNTER — Encounter: Payer: Self-pay | Admitting: Occupational Therapy
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
## 2020-02-12 ENCOUNTER — Ambulatory Visit: Payer: BC Managed Care – PPO | Admitting: Occupational Therapy

## 2020-02-12 ENCOUNTER — Encounter: Payer: Self-pay | Admitting: Occupational Therapy

## 2020-02-12 ENCOUNTER — Other Ambulatory Visit: Payer: Self-pay

## 2020-02-12 DIAGNOSIS — M6281 Muscle weakness (generalized): Secondary | ICD-10-CM | POA: Diagnosis not present
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
## 2020-02-17 ENCOUNTER — Encounter: Payer: Self-pay | Admitting: Occupational Therapy

## 2020-02-17 ENCOUNTER — Other Ambulatory Visit: Payer: Self-pay

## 2020-02-17 ENCOUNTER — Ambulatory Visit: Payer: BC Managed Care – PPO | Attending: Physical Medicine and Rehabilitation | Admitting: Occupational Therapy

## 2020-02-17 DIAGNOSIS — R278 Other lack of coordination: Secondary | ICD-10-CM | POA: Diagnosis present

## 2020-02-17 DIAGNOSIS — M6281 Muscle weakness (generalized): Secondary | ICD-10-CM
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
## 2020-02-19 ENCOUNTER — Encounter: Payer: Self-pay | Admitting: Occupational Therapy

## 2020-02-19 ENCOUNTER — Ambulatory Visit: Payer: BC Managed Care – PPO | Admitting: Occupational Therapy

## 2020-02-19 ENCOUNTER — Other Ambulatory Visit: Payer: Self-pay

## 2020-02-19 DIAGNOSIS — R278 Other lack of coordination: Secondary | ICD-10-CM

## 2020-02-19 DIAGNOSIS — M6281 Muscle weakness (generalized): Secondary | ICD-10-CM | POA: Diagnosis not present
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
## 2020-02-24 ENCOUNTER — Other Ambulatory Visit: Payer: Self-pay

## 2020-02-24 ENCOUNTER — Ambulatory Visit: Payer: BC Managed Care – PPO | Admitting: Occupational Therapy

## 2020-02-24 ENCOUNTER — Encounter: Payer: Self-pay | Admitting: Occupational Therapy

## 2020-02-24 DIAGNOSIS — R278 Other lack of coordination: Secondary | ICD-10-CM

## 2020-02-24 DIAGNOSIS — M6281 Muscle weakness (generalized): Secondary | ICD-10-CM
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
## 2020-02-26 ENCOUNTER — Ambulatory Visit: Payer: BC Managed Care – PPO | Admitting: Occupational Therapy

## 2020-02-26 ENCOUNTER — Encounter: Payer: Self-pay | Admitting: Occupational Therapy

## 2020-02-26 ENCOUNTER — Other Ambulatory Visit: Payer: Self-pay

## 2020-02-26 DIAGNOSIS — M6281 Muscle weakness (generalized): Secondary | ICD-10-CM

## 2020-02-26 DIAGNOSIS — R278 Other lack of coordination: Secondary | ICD-10-CM
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
## 2020-03-02 ENCOUNTER — Other Ambulatory Visit: Payer: Self-pay

## 2020-03-02 ENCOUNTER — Ambulatory Visit: Payer: BC Managed Care – PPO | Admitting: Occupational Therapy

## 2020-03-02 DIAGNOSIS — R278 Other lack of coordination: Secondary | ICD-10-CM

## 2020-03-02 DIAGNOSIS — M6281 Muscle weakness (generalized): Secondary | ICD-10-CM | POA: Diagnosis not present

## 2020-03-04 ENCOUNTER — Ambulatory Visit: Payer: BC Managed Care – PPO | Admitting: Occupational Therapy

## 2020-03-04 ENCOUNTER — Encounter: Payer: Self-pay | Admitting: Occupational Therapy

## 2020-03-04 ENCOUNTER — Other Ambulatory Visit: Payer: Self-pay

## 2020-03-04 DIAGNOSIS — M6281 Muscle weakness (generalized): Secondary | ICD-10-CM

## 2020-03-04 DIAGNOSIS — R278 Other lack of coordination: Secondary | ICD-10-CM
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
## 2020-03-06 ENCOUNTER — Encounter: Payer: Self-pay | Admitting: Occupational Therapy
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
## 2020-03-09 ENCOUNTER — Ambulatory Visit: Payer: BC Managed Care – PPO | Admitting: Occupational Therapy

## 2020-03-11 ENCOUNTER — Other Ambulatory Visit: Payer: Self-pay

## 2020-03-11 ENCOUNTER — Ambulatory Visit: Payer: BC Managed Care – PPO | Admitting: Occupational Therapy

## 2020-03-11 ENCOUNTER — Encounter: Payer: Self-pay | Admitting: Occupational Therapy

## 2020-03-11 DIAGNOSIS — M6281 Muscle weakness (generalized): Secondary | ICD-10-CM

## 2020-03-11 DIAGNOSIS — R278 Other lack of coordination: Secondary | ICD-10-CM
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
## 2020-03-16 ENCOUNTER — Other Ambulatory Visit: Payer: Self-pay

## 2020-03-16 ENCOUNTER — Ambulatory Visit: Payer: BC Managed Care – PPO | Admitting: Occupational Therapy

## 2020-03-16 ENCOUNTER — Encounter: Payer: Self-pay | Admitting: Occupational Therapy

## 2020-03-16 DIAGNOSIS — M6281 Muscle weakness (generalized): Secondary | ICD-10-CM
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
## 2020-03-18 ENCOUNTER — Ambulatory Visit: Payer: BC Managed Care – PPO | Attending: Physical Medicine and Rehabilitation | Admitting: Occupational Therapy

## 2020-03-18 ENCOUNTER — Other Ambulatory Visit: Payer: Self-pay

## 2020-03-18 DIAGNOSIS — M6281 Muscle weakness (generalized): Secondary | ICD-10-CM | POA: Insufficient documentation

## 2020-03-18 DIAGNOSIS — R278 Other lack of coordination: Secondary | ICD-10-CM | POA: Diagnosis present
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
## 2020-03-23 ENCOUNTER — Ambulatory Visit: Payer: BC Managed Care – PPO | Admitting: Occupational Therapy

## 2020-03-23 ENCOUNTER — Other Ambulatory Visit: Payer: Self-pay

## 2020-03-23 DIAGNOSIS — M6281 Muscle weakness (generalized): Secondary | ICD-10-CM

## 2020-03-23 DIAGNOSIS — R278 Other lack of coordination: Secondary | ICD-10-CM

## 2020-03-24 ENCOUNTER — Encounter: Payer: Self-pay | Admitting: Occupational Therapy
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
## 2020-03-25 ENCOUNTER — Other Ambulatory Visit: Payer: Self-pay

## 2020-03-25 ENCOUNTER — Ambulatory Visit: Payer: BC Managed Care – PPO | Admitting: Occupational Therapy

## 2020-03-25 ENCOUNTER — Encounter: Payer: Self-pay | Admitting: Occupational Therapy

## 2020-03-25 DIAGNOSIS — R278 Other lack of coordination: Secondary | ICD-10-CM

## 2020-03-25 DIAGNOSIS — M6281 Muscle weakness (generalized): Secondary | ICD-10-CM
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
## 2020-03-30 ENCOUNTER — Ambulatory Visit: Payer: BC Managed Care – PPO | Admitting: Occupational Therapy

## 2020-04-01 ENCOUNTER — Ambulatory Visit: Payer: BC Managed Care – PPO | Admitting: Occupational Therapy

## 2020-04-01 ENCOUNTER — Encounter: Payer: Self-pay | Admitting: Occupational Therapy

## 2020-04-01 ENCOUNTER — Other Ambulatory Visit: Payer: Self-pay

## 2020-04-01 DIAGNOSIS — M6281 Muscle weakness (generalized): Secondary | ICD-10-CM

## 2020-04-01 DIAGNOSIS — R278 Other lack of coordination: Secondary | ICD-10-CM
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
## 2020-04-06 ENCOUNTER — Other Ambulatory Visit: Payer: Self-pay

## 2020-04-06 ENCOUNTER — Ambulatory Visit: Payer: BC Managed Care – PPO | Admitting: Occupational Therapy

## 2020-04-06 DIAGNOSIS — R278 Other lack of coordination: Secondary | ICD-10-CM

## 2020-04-06 DIAGNOSIS — M6281 Muscle weakness (generalized): Secondary | ICD-10-CM

## 2020-04-08 ENCOUNTER — Other Ambulatory Visit: Payer: Self-pay

## 2020-04-08 ENCOUNTER — Ambulatory Visit: Payer: BC Managed Care – PPO | Admitting: Occupational Therapy

## 2020-04-08 ENCOUNTER — Encounter: Payer: Self-pay | Admitting: Occupational Therapy

## 2020-04-08 DIAGNOSIS — M6281 Muscle weakness (generalized): Secondary | ICD-10-CM | POA: Diagnosis not present

## 2020-04-08 DIAGNOSIS — R278 Other lack of coordination: Secondary | ICD-10-CM
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
## 2020-04-09 ENCOUNTER — Encounter: Payer: Self-pay | Admitting: Occupational Therapy
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
## 2020-04-13 ENCOUNTER — Other Ambulatory Visit: Payer: Self-pay

## 2020-04-13 ENCOUNTER — Ambulatory Visit: Payer: BC Managed Care – PPO | Admitting: Occupational Therapy

## 2020-04-13 ENCOUNTER — Encounter: Payer: Self-pay | Admitting: Occupational Therapy

## 2020-04-13 DIAGNOSIS — M6281 Muscle weakness (generalized): Secondary | ICD-10-CM | POA: Diagnosis not present

## 2020-04-13 DIAGNOSIS — R278 Other lack of coordination: Secondary | ICD-10-CM
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
## 2020-04-15 ENCOUNTER — Ambulatory Visit: Payer: BC Managed Care – PPO | Admitting: Occupational Therapy

## 2020-04-20 ENCOUNTER — Ambulatory Visit: Payer: BC Managed Care – PPO | Admitting: Occupational Therapy

## 2020-04-22 ENCOUNTER — Other Ambulatory Visit: Payer: Self-pay

## 2020-04-22 ENCOUNTER — Ambulatory Visit: Payer: BC Managed Care – PPO | Attending: Physical Medicine and Rehabilitation | Admitting: Occupational Therapy

## 2020-04-22 DIAGNOSIS — R278 Other lack of coordination: Secondary | ICD-10-CM | POA: Diagnosis present

## 2020-04-22 DIAGNOSIS — M6281 Muscle weakness (generalized): Secondary | ICD-10-CM | POA: Diagnosis present

## 2020-04-23 ENCOUNTER — Encounter: Payer: Self-pay | Admitting: Occupational Therapy
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
## 2020-04-27 ENCOUNTER — Encounter: Payer: Self-pay | Admitting: Occupational Therapy

## 2020-04-27 ENCOUNTER — Ambulatory Visit: Payer: BC Managed Care – PPO | Admitting: Occupational Therapy

## 2020-04-27 ENCOUNTER — Other Ambulatory Visit: Payer: Self-pay

## 2020-04-27 DIAGNOSIS — M6281 Muscle weakness (generalized): Secondary | ICD-10-CM
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
## 2020-04-29 ENCOUNTER — Other Ambulatory Visit: Payer: Self-pay

## 2020-04-29 ENCOUNTER — Encounter: Payer: Self-pay | Admitting: Occupational Therapy

## 2020-04-29 ENCOUNTER — Ambulatory Visit: Payer: BC Managed Care – PPO | Admitting: Occupational Therapy

## 2020-04-29 DIAGNOSIS — R278 Other lack of coordination: Secondary | ICD-10-CM

## 2020-04-29 DIAGNOSIS — M6281 Muscle weakness (generalized): Secondary | ICD-10-CM
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
## 2020-05-04 ENCOUNTER — Ambulatory Visit: Payer: BC Managed Care – PPO | Admitting: Occupational Therapy

## 2020-05-06 ENCOUNTER — Ambulatory Visit: Payer: BC Managed Care – PPO | Admitting: Occupational Therapy

## 2020-05-06 ENCOUNTER — Other Ambulatory Visit: Payer: Self-pay

## 2020-05-06 ENCOUNTER — Encounter: Payer: Self-pay | Admitting: Occupational Therapy

## 2020-05-06 DIAGNOSIS — M6281 Muscle weakness (generalized): Secondary | ICD-10-CM
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
## 2020-05-11 ENCOUNTER — Other Ambulatory Visit: Payer: Self-pay

## 2020-05-11 ENCOUNTER — Ambulatory Visit: Payer: BC Managed Care – PPO | Admitting: Occupational Therapy

## 2020-05-11 DIAGNOSIS — M6281 Muscle weakness (generalized): Secondary | ICD-10-CM
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
## 2020-05-13 ENCOUNTER — Ambulatory Visit: Payer: BC Managed Care – PPO | Admitting: Occupational Therapy

## 2020-05-18 ENCOUNTER — Ambulatory Visit: Payer: BC Managed Care – PPO | Admitting: Occupational Therapy

## 2020-05-20 ENCOUNTER — Ambulatory Visit: Payer: BC Managed Care – PPO | Admitting: Occupational Therapy

## 2020-05-25 ENCOUNTER — Ambulatory Visit: Payer: BC Managed Care – PPO | Attending: Physical Medicine and Rehabilitation | Admitting: Occupational Therapy

## 2020-05-25 ENCOUNTER — Other Ambulatory Visit: Payer: Self-pay

## 2020-05-25 ENCOUNTER — Encounter: Payer: Self-pay | Admitting: Occupational Therapy

## 2020-05-25 DIAGNOSIS — R278 Other lack of coordination: Secondary | ICD-10-CM | POA: Diagnosis present

## 2020-05-25 DIAGNOSIS — M6281 Muscle weakness (generalized): Secondary | ICD-10-CM | POA: Insufficient documentation
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
## 2020-05-27 ENCOUNTER — Ambulatory Visit: Payer: BC Managed Care – PPO | Admitting: Occupational Therapy

## 2020-05-27 ENCOUNTER — Encounter: Payer: Self-pay | Admitting: Occupational Therapy

## 2020-05-27 ENCOUNTER — Other Ambulatory Visit: Payer: Self-pay

## 2020-05-27 DIAGNOSIS — R278 Other lack of coordination: Secondary | ICD-10-CM

## 2020-05-27 DIAGNOSIS — M6281 Muscle weakness (generalized): Secondary | ICD-10-CM | POA: Diagnosis not present
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
## 2020-06-01 ENCOUNTER — Encounter: Payer: Self-pay | Admitting: Occupational Therapy

## 2020-06-01 ENCOUNTER — Other Ambulatory Visit: Payer: Self-pay

## 2020-06-01 ENCOUNTER — Ambulatory Visit: Payer: BC Managed Care – PPO | Admitting: Occupational Therapy

## 2020-06-01 DIAGNOSIS — M6281 Muscle weakness (generalized): Secondary | ICD-10-CM
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
## 2020-06-03 ENCOUNTER — Ambulatory Visit: Payer: BC Managed Care – PPO | Admitting: Occupational Therapy

## 2020-06-03 ENCOUNTER — Other Ambulatory Visit: Payer: Self-pay

## 2020-06-03 DIAGNOSIS — M6281 Muscle weakness (generalized): Secondary | ICD-10-CM | POA: Diagnosis not present
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
## 2020-06-08 ENCOUNTER — Other Ambulatory Visit: Payer: Self-pay

## 2020-06-08 ENCOUNTER — Ambulatory Visit: Payer: BC Managed Care – PPO | Admitting: Occupational Therapy

## 2020-06-08 ENCOUNTER — Encounter: Payer: Self-pay | Admitting: Occupational Therapy

## 2020-06-08 DIAGNOSIS — M6281 Muscle weakness (generalized): Secondary | ICD-10-CM

## 2020-06-08 DIAGNOSIS — R278 Other lack of coordination: Secondary | ICD-10-CM
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
## 2020-06-10 ENCOUNTER — Encounter: Payer: Self-pay | Admitting: Occupational Therapy

## 2020-06-10 ENCOUNTER — Other Ambulatory Visit: Payer: Self-pay

## 2020-06-10 ENCOUNTER — Ambulatory Visit: Payer: BC Managed Care – PPO | Admitting: Occupational Therapy

## 2020-06-10 DIAGNOSIS — R278 Other lack of coordination: Secondary | ICD-10-CM

## 2020-06-10 DIAGNOSIS — M6281 Muscle weakness (generalized): Secondary | ICD-10-CM
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
## 2020-06-15 ENCOUNTER — Ambulatory Visit: Payer: BC Managed Care – PPO | Admitting: Occupational Therapy

## 2020-06-15 ENCOUNTER — Other Ambulatory Visit: Payer: Self-pay

## 2020-06-15 ENCOUNTER — Encounter: Payer: Self-pay | Admitting: Occupational Therapy

## 2020-06-15 DIAGNOSIS — R278 Other lack of coordination: Secondary | ICD-10-CM

## 2020-06-15 DIAGNOSIS — M6281 Muscle weakness (generalized): Secondary | ICD-10-CM | POA: Diagnosis not present
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
## 2020-06-17 ENCOUNTER — Encounter: Payer: Self-pay | Admitting: Occupational Therapy

## 2020-06-17 ENCOUNTER — Other Ambulatory Visit: Payer: Self-pay

## 2020-06-17 ENCOUNTER — Ambulatory Visit: Payer: BC Managed Care – PPO | Attending: Physical Medicine and Rehabilitation | Admitting: Occupational Therapy

## 2020-06-17 DIAGNOSIS — R278 Other lack of coordination: Secondary | ICD-10-CM | POA: Diagnosis present

## 2020-06-17 DIAGNOSIS — M6281 Muscle weakness (generalized): Secondary | ICD-10-CM | POA: Diagnosis present
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
## 2020-06-22 ENCOUNTER — Ambulatory Visit: Payer: BC Managed Care – PPO | Admitting: Occupational Therapy

## 2020-06-22 ENCOUNTER — Other Ambulatory Visit: Payer: Self-pay

## 2020-06-22 ENCOUNTER — Encounter: Payer: Self-pay | Admitting: Occupational Therapy

## 2020-06-22 DIAGNOSIS — R278 Other lack of coordination: Secondary | ICD-10-CM

## 2020-06-22 DIAGNOSIS — M6281 Muscle weakness (generalized): Secondary | ICD-10-CM | POA: Diagnosis not present
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
## 2020-06-24 ENCOUNTER — Ambulatory Visit: Payer: BC Managed Care – PPO | Admitting: Occupational Therapy

## 2020-06-24 ENCOUNTER — Other Ambulatory Visit: Payer: Self-pay

## 2020-06-24 ENCOUNTER — Encounter: Payer: Self-pay | Admitting: Occupational Therapy

## 2020-06-24 DIAGNOSIS — M6281 Muscle weakness (generalized): Secondary | ICD-10-CM
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
## 2020-06-29 ENCOUNTER — Ambulatory Visit: Payer: BC Managed Care – PPO | Admitting: Occupational Therapy

## 2020-06-29 ENCOUNTER — Other Ambulatory Visit: Payer: Self-pay

## 2020-06-29 ENCOUNTER — Encounter: Payer: Self-pay | Admitting: Occupational Therapy

## 2020-06-29 DIAGNOSIS — M6281 Muscle weakness (generalized): Secondary | ICD-10-CM | POA: Diagnosis not present
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
## 2020-07-01 ENCOUNTER — Other Ambulatory Visit: Payer: Self-pay

## 2020-07-01 ENCOUNTER — Ambulatory Visit: Payer: BC Managed Care – PPO | Admitting: Occupational Therapy

## 2020-07-01 DIAGNOSIS — M6281 Muscle weakness (generalized): Secondary | ICD-10-CM | POA: Diagnosis not present

## 2020-07-01 DIAGNOSIS — R278 Other lack of coordination: Secondary | ICD-10-CM

## 2020-07-02 ENCOUNTER — Encounter: Payer: Self-pay | Admitting: Occupational Therapy
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
## 2020-07-06 ENCOUNTER — Other Ambulatory Visit: Payer: Self-pay

## 2020-07-06 ENCOUNTER — Ambulatory Visit: Payer: BC Managed Care – PPO | Admitting: Occupational Therapy

## 2020-07-06 ENCOUNTER — Encounter: Payer: Self-pay | Admitting: Occupational Therapy

## 2020-07-06 DIAGNOSIS — R278 Other lack of coordination: Secondary | ICD-10-CM

## 2020-07-06 DIAGNOSIS — M6281 Muscle weakness (generalized): Secondary | ICD-10-CM
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
## 2020-07-08 ENCOUNTER — Other Ambulatory Visit: Payer: Self-pay

## 2020-07-08 ENCOUNTER — Ambulatory Visit: Payer: BC Managed Care – PPO | Admitting: Occupational Therapy

## 2020-07-08 DIAGNOSIS — M6281 Muscle weakness (generalized): Secondary | ICD-10-CM

## 2020-07-09 ENCOUNTER — Encounter: Payer: Self-pay | Admitting: Occupational Therapy
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
## 2020-07-13 ENCOUNTER — Other Ambulatory Visit: Payer: Self-pay

## 2020-07-13 ENCOUNTER — Ambulatory Visit: Payer: BC Managed Care – PPO | Admitting: Occupational Therapy

## 2020-07-13 DIAGNOSIS — R278 Other lack of coordination: Secondary | ICD-10-CM

## 2020-07-13 DIAGNOSIS — M6281 Muscle weakness (generalized): Secondary | ICD-10-CM | POA: Diagnosis not present

## 2020-07-15 ENCOUNTER — Encounter: Payer: Self-pay | Admitting: Occupational Therapy
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
## 2020-07-20 ENCOUNTER — Encounter: Payer: BC Managed Care – PPO | Admitting: Occupational Therapy

## 2020-07-22 ENCOUNTER — Encounter: Payer: BC Managed Care – PPO | Admitting: Occupational Therapy

## 2020-07-27 ENCOUNTER — Encounter: Payer: BC Managed Care – PPO | Admitting: Occupational Therapy

## 2020-07-29 ENCOUNTER — Encounter: Payer: BC Managed Care – PPO | Admitting: Occupational Therapy

## 2020-08-03 ENCOUNTER — Encounter: Payer: BC Managed Care – PPO | Admitting: Occupational Therapy

## 2020-08-05 ENCOUNTER — Encounter: Payer: BC Managed Care – PPO | Admitting: Occupational Therapy

## 2020-08-10 ENCOUNTER — Encounter: Payer: BC Managed Care – PPO | Admitting: Occupational Therapy

## 2020-08-12 ENCOUNTER — Encounter: Payer: BC Managed Care – PPO | Admitting: Occupational Therapy

## 2020-10-01 IMAGING — CR DG CERVICAL SPINE 1V
1 series · 1 of 1 positions shown · non-contrast
Comparison: None.

CLINICAL DATA: 19-year-old male with concern for shunt malfunction.

EXAM:
SKULL - 1-3 VIEW; CHEST 1 VIEW; DG CERVICAL SPINE - 1 VIEW; ABDOMEN
- 1 VIEW

[dg cervical spine 1 view]
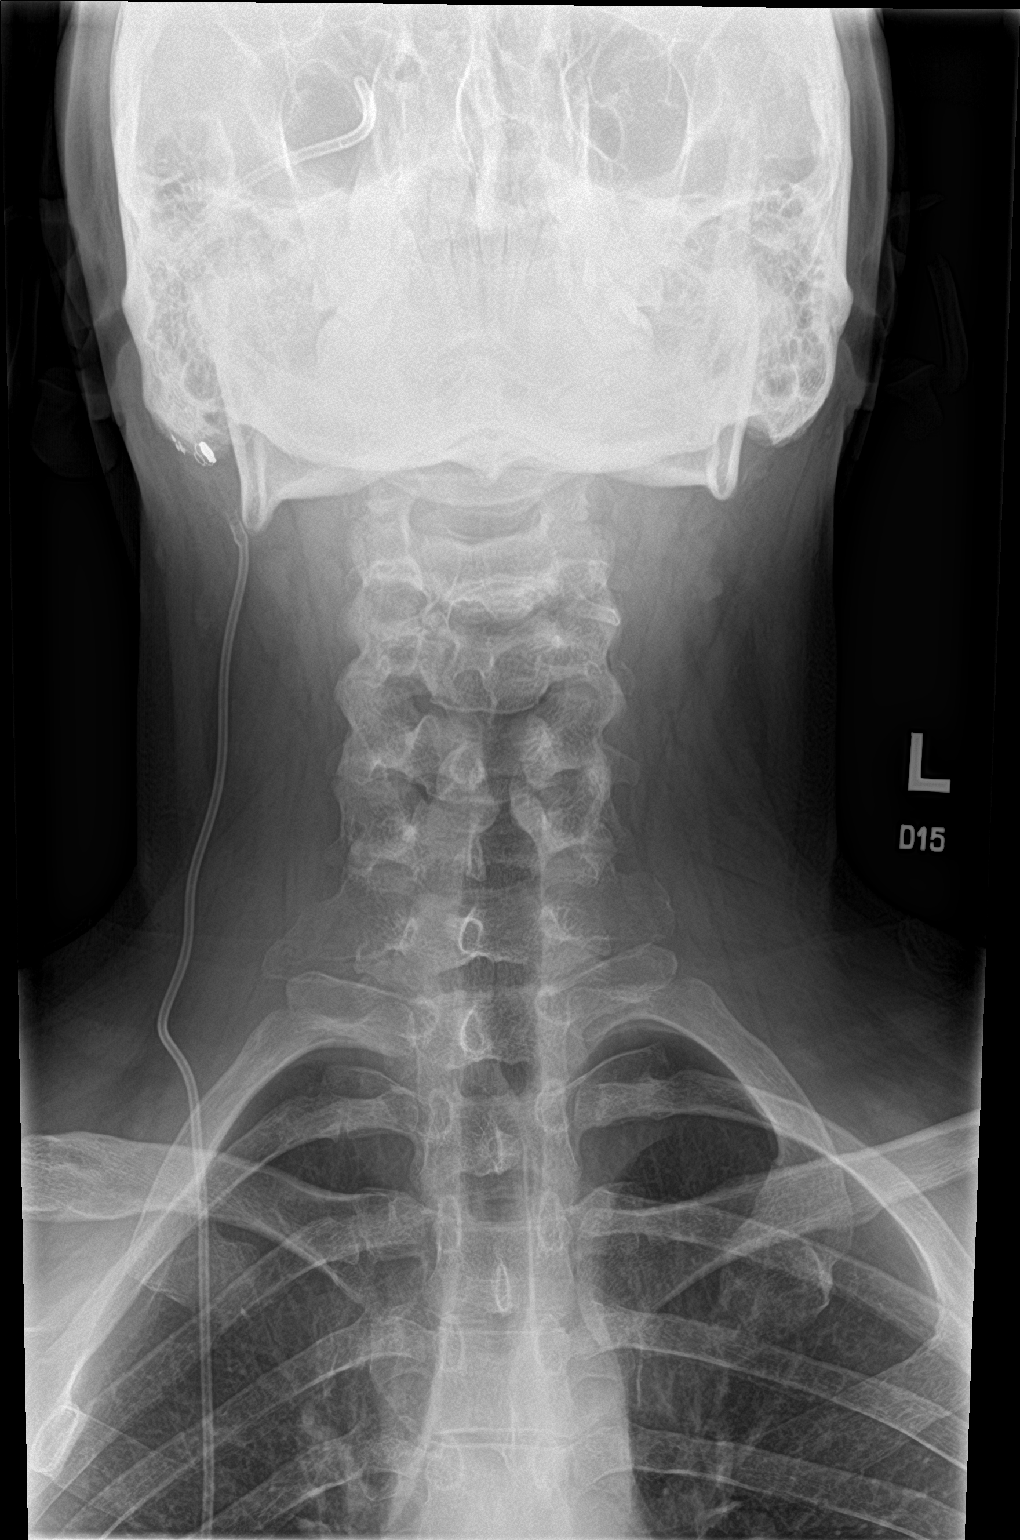

[1 of 1 positions shown; findings below may reference images not displayed]

FINDINGS: Right parietal approach VP shunt with intracranial tip projecting
posterior to the right orbit. The visualized shunt tubing over the
neck, right side of the chest, abdomen appears intact. The distal
end of the shunt is located in the right hemipelvis. There is
however a focal kink in the shunt tube in the right upper abdomen
which is suboptimally evaluated on this frontal only view. A lateral
view may provide additional information.

No acute findings noted in the chest, abdomen, or pelvis.
IMPRESSION: Focal kink in the shunt tube in the right upper abdomen. A lateral
view may provide additional information.

## 2020-10-01 IMAGING — CR DG ABDOMEN 1V
2 series · 2 of 2 positions shown · non-contrast
Comparison: None.

CLINICAL DATA: Kinked VP shunt

EXAM:
ABDOMEN - 1 VIEW

[abdomen kub (1 of 2)]
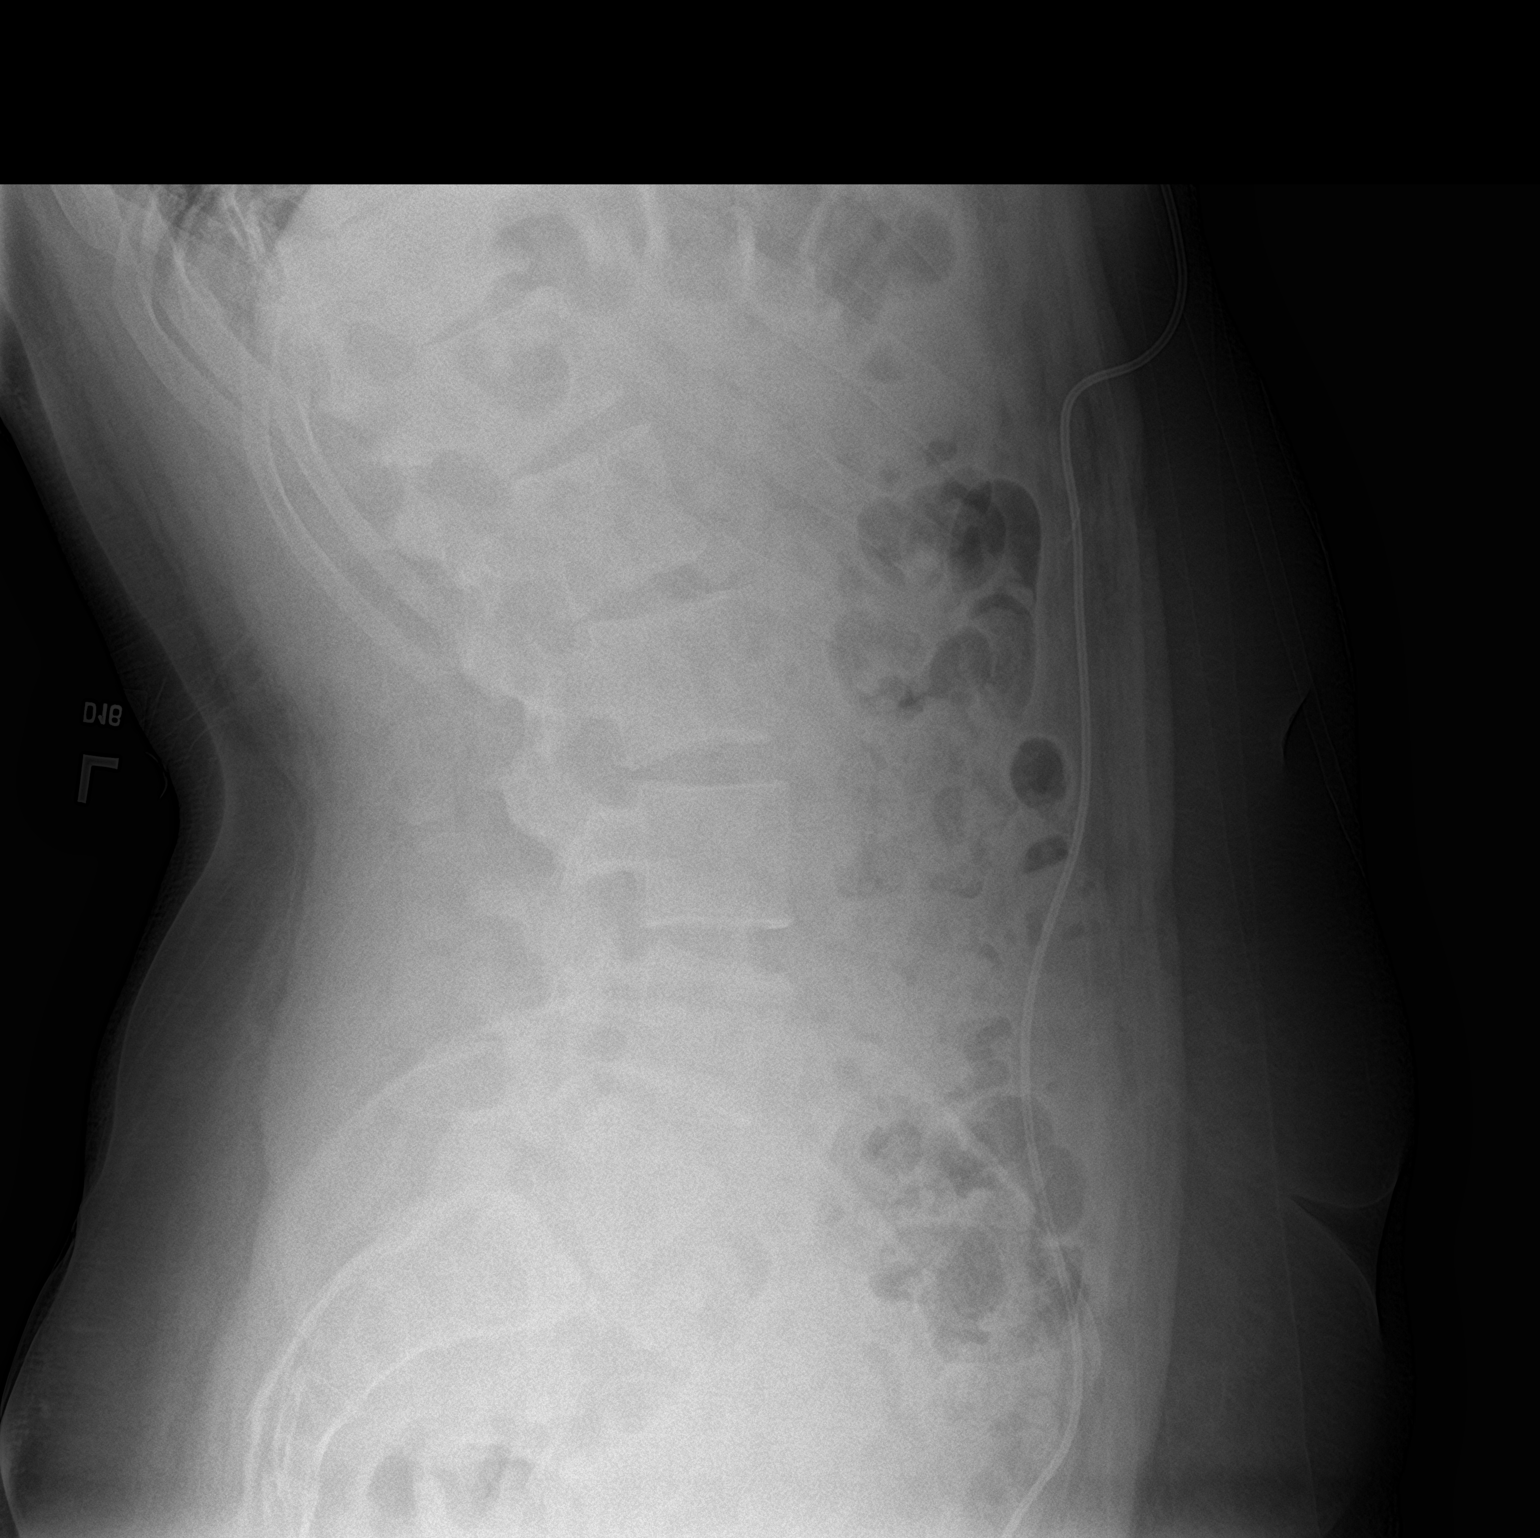

[abdomen kub (2 of 2)]
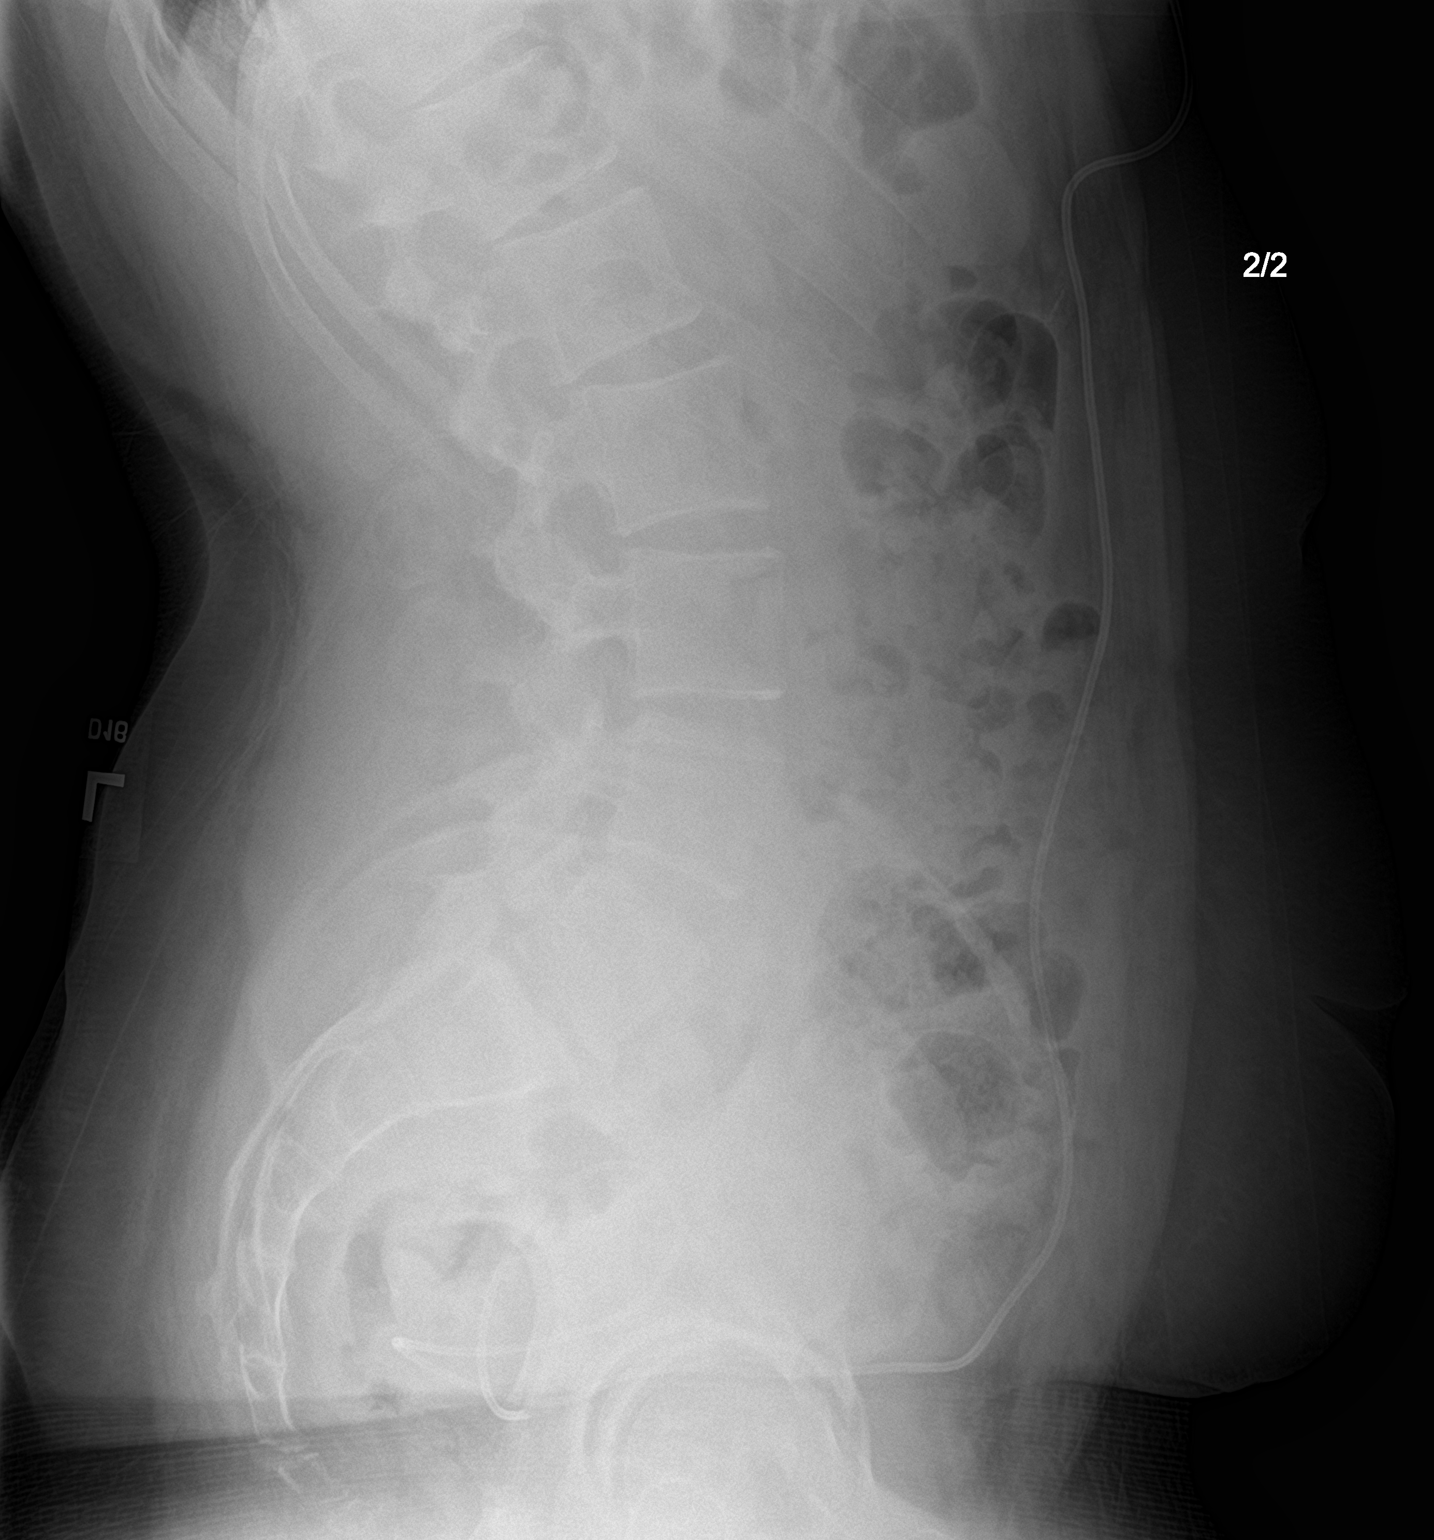

[2 of 2 positions shown; findings below may reference images not displayed]

FINDINGS: The catheter shunt overlying the anterior abdomen appears to be
intact. The shunt coiled within the deep pelvis. Air and stool seen
within the bowel gas.
IMPRESSION: The VP shunt appears to be intact.

## 2020-12-06 ENCOUNTER — Ambulatory Visit
Admission: EM | Admit: 2020-12-06 | Discharge: 2020-12-06 | Disposition: A | Discharge status: Home / Self Care | Payer: BC Managed Care – PPO | Attending: Internal Medicine | Admitting: Internal Medicine

## 2020-12-06 ENCOUNTER — Encounter: Payer: Self-pay | Admitting: Emergency Medicine

## 2020-12-06 ENCOUNTER — Other Ambulatory Visit: Payer: Self-pay

## 2020-12-06 DIAGNOSIS — Z20822 Contact with and (suspected) exposure to covid-19: Secondary | ICD-10-CM | POA: Insufficient documentation

## 2020-12-06 DIAGNOSIS — K141 Geographic tongue: Secondary | ICD-10-CM | POA: Insufficient documentation

## 2020-12-06 NOTE — ED Provider Notes (Signed)
[facility]    CSN: [id number] Arrival date & time: 12/06/20  1155      History   Chief Complaint Chief Complaint  Patient presents with   Mouth Lesions    HPI Edgar [name] is a 20 y.o. male who presents with a sore on his R tongue. This is not painful. States that he kissed someone he was dating on her lips only. Then one day she asked him to let her look at his tongue, but did not say why. He looked on the mirror and noticed a circular area on the R. Has metalic taste in his mouth, and has been feeling SOB and worried he has an STD.    Past Medical History:  Diagnosis Date   Traumatic brain injury (HCC) 2017    There are no problems to display for this patient.   Past Surgical History:  Procedure Laterality Date   CSF SHUNT  2017   wisdom tooth removal         Home Medications    Prior to Admission medications   Medication Sig Start Date End Date Taking? Authorizing Provider  aspirin EC 81 MG tablet Take 81 mg by mouth daily.   Yes [provider]  Baclofen 5 MG TABS Take 5 mg by mouth 3 (three) times daily as needed. 11/18/19   Enid Derry, PA-C    Family History No family history on file.  Social History Social History   Tobacco Use   Smoking status: Never   Smokeless tobacco: Never  Vaping Use   Vaping Use: Never used  Substance Use Topics   Alcohol use: No   Drug use: No     Allergies   Patient has no known allergies.   Review of Systems Review of Systems  Constitutional:  Negative for diaphoresis and fever.  HENT:  Negative for drooling, mouth sores, sore throat and trouble swallowing.   Respiratory:  Positive for shortness of breath. Negative for cough and chest tightness.   Cardiovascular:  Negative for chest pain and leg swelling.  Skin:  Negative for rash.  Hematological:  Negative for adenopathy.  Psychiatric/Behavioral:  The patient is nervous/anxious.     Physical Exam Triage Vital Signs ED Triage  Vitals  Enc Vitals Group     BP 12/06/20 1222 112/75     Pulse Rate 12/06/20 1222 70     Resp 12/06/20 1222 18     Temp 12/06/20 1222 98.4 F (36.9 C)     Temp Source 12/06/20 1222 Oral     SpO2 12/06/20 1222 100 %     Weight 12/06/20 1218 240 lb 1.3 oz (108.9 kg)     Height 12/06/20 1218 6\' 4"  (1.93 m)     Head Circumference --      Peak Flow --      Pain Score 12/06/20 1218 0     Pain Loc --      Pain Edu? --      Excl. in GC? --    No data found.  Updated Vital Signs BP 112/75 (BP Location: Left Arm)   Pulse 70   Temp 98.4 F (36.9 C) (Oral)   Resp 18   Ht 6\' 4"  (1.93 m)   Wt 240 lb 1.3 oz (108.9 kg)   SpO2 100%   BMI 29.22 kg/m   Visual Acuity Right Eye Distance:   Left Eye Distance:   Bilateral Distance:    Right Eye Near:   Left  Eye Near:    Bilateral Near:     Physical Exam Vitals and nursing note reviewed.  Constitutional:      General: He is not in acute distress.    Appearance: He is obese. He is not toxic-appearing.  HENT:     Head: Normocephalic.     Right Ear: External ear normal.     Left Ear: External ear normal.     Nose: Nose normal.     Mouth/Throat:     Mouth: Mucous membranes are moist.     Pharynx: Oropharynx is clear.     Comments: His lateral tongue has circular white border with pink( tongue color) center. Has similar look on the L, just a little more posterior. It is not painful.  Eyes:     General: No scleral icterus.    Conjunctiva/sclera: Conjunctivae normal.  Cardiovascular:     Rate and Rhythm: Normal rate and regular rhythm.     Heart sounds: No murmur heard. Pulmonary:     Effort: Pulmonary effort is normal.     Breath sounds: Normal breath sounds.  Musculoskeletal:        General: Normal range of motion.     Cervical back: Neck supple.  Lymphadenopathy:     Cervical: No cervical adenopathy.  Skin:    General: Skin is warm and dry.     Findings: No rash.  Neurological:     Mental Status: He is alert and oriented  to person, place, and time.     Gait: Gait normal.  Psychiatric:        Behavior: Behavior normal.        Thought Content: Thought content normal.        Judgment: Judgment normal.     Comments: Is anxious     UC Treatments / Results  Labs (all labs ordered are listed, but only abnormal results are displayed) Labs Reviewed  SARS CORONAVIRUS 2 (TAT 6-24 HRS)    EKG   Radiology No results found.  Procedures Procedures (including critical care time)  Medications Ordered in UC Medications - No data to display  Initial Impression / Assessment and Plan / UC Course  I have reviewed the triage vital signs and the nursing notes. 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 I reassured him the changes on his tongue is not a concern for STD. Fu prn     Final Clinical Impressions(s) / UC Diagnoses   Final diagnoses:  None   Discharge Instructions   None    ED Prescriptions   None    PDMP not reviewed this encounter.   Garey Ham, PA-C 12/06/20 1330

## 2020-12-06 NOTE — Discharge Instructions (Addendum)
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 On the tongue, local loss of filiform papillae leads to depapillated, red patches with circumferential, white, polycyclic borders that give the dorsal tongue the appearance of a map (picture 46A-B). Lesions can change location, pattern, and size very rapidly (migratory), and patients may have numerous exacerbations and remissions over time. Patients are usually asymptomatic, have only mild symptoms, or complain of oral discomfort, burning, and sensitivity to some foods. The condition ha

## 2020-12-06 NOTE — ED Triage Notes (Signed)
Pt c/o mouth lesion on the right side of his tongue. He states he kissed a girl and then he noticed a white circular lesion. It is not painful. Pt states he also has not been felling well since and felt like he was short of breath, and change in his taste. He states he has a metallic taste.

## 2020-12-07 LAB — SARS CORONAVIRUS 2 (TAT 6-24 HRS): SARS Coronavirus 2: NEGATIVE
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
## 2021-07-19 ENCOUNTER — Ambulatory Visit: Payer: BC Managed Care – PPO | Admitting: Occupational Therapy

## 2021-08-03 ENCOUNTER — Ambulatory Visit: Payer: BC Managed Care – PPO | Attending: Physician Assistant | Admitting: Occupational Therapy

## 2021-08-03 DIAGNOSIS — R278 Other lack of coordination: Secondary | ICD-10-CM

## 2021-08-03 DIAGNOSIS — M6281 Muscle weakness (generalized): Secondary | ICD-10-CM | POA: Diagnosis present

## 2021-08-04 ENCOUNTER — Encounter: Payer: Self-pay | Admitting: Occupational Therapy
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
## 2021-08-09 ENCOUNTER — Ambulatory Visit: Payer: BC Managed Care – PPO | Admitting: Occupational Therapy

## 2021-08-09 DIAGNOSIS — M6281 Muscle weakness (generalized): Secondary | ICD-10-CM
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
## 2021-08-11 ENCOUNTER — Ambulatory Visit: Payer: BC Managed Care – PPO | Admitting: Occupational Therapy

## 2021-08-11 ENCOUNTER — Encounter: Payer: Self-pay | Admitting: Occupational Therapy

## 2021-08-11 DIAGNOSIS — R278 Other lack of coordination: Secondary | ICD-10-CM

## 2021-08-11 DIAGNOSIS — M6281 Muscle weakness (generalized): Secondary | ICD-10-CM
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
## 2021-08-16 ENCOUNTER — Ambulatory Visit: Payer: BC Managed Care – PPO | Attending: Physician Assistant | Admitting: Occupational Therapy

## 2021-08-16 ENCOUNTER — Encounter: Payer: Self-pay | Admitting: Occupational Therapy

## 2021-08-16 DIAGNOSIS — Z9181 History of falling: Secondary | ICD-10-CM | POA: Diagnosis present

## 2021-08-16 DIAGNOSIS — M6281 Muscle weakness (generalized): Secondary | ICD-10-CM | POA: Diagnosis not present

## 2021-08-16 DIAGNOSIS — R4701 Aphasia: Secondary | ICD-10-CM | POA: Insufficient documentation

## 2021-08-16 DIAGNOSIS — R278 Other lack of coordination: Secondary | ICD-10-CM | POA: Diagnosis present

## 2021-08-16 DIAGNOSIS — R41841 Cognitive communication deficit: Secondary | ICD-10-CM | POA: Diagnosis present

## 2021-08-16 DIAGNOSIS — R2681 Unsteadiness on feet: Secondary | ICD-10-CM | POA: Insufficient documentation
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
## 2021-08-23 ENCOUNTER — Ambulatory Visit: Payer: BC Managed Care – PPO | Admitting: Occupational Therapy

## 2021-08-23 DIAGNOSIS — M6281 Muscle weakness (generalized): Secondary | ICD-10-CM
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
## 2021-08-25 ENCOUNTER — Ambulatory Visit: Payer: BC Managed Care – PPO | Admitting: Speech Pathology

## 2021-08-25 ENCOUNTER — Ambulatory Visit: Payer: BC Managed Care – PPO | Admitting: Occupational Therapy

## 2021-08-25 DIAGNOSIS — R4701 Aphasia: Secondary | ICD-10-CM

## 2021-08-25 DIAGNOSIS — R41841 Cognitive communication deficit: Secondary | ICD-10-CM

## 2021-08-25 DIAGNOSIS — M6281 Muscle weakness (generalized): Secondary | ICD-10-CM | POA: Diagnosis not present
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
## 2021-08-26 ENCOUNTER — Encounter: Payer: BC Managed Care – PPO | Admitting: Speech Pathology
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
## 2021-08-30 ENCOUNTER — Ambulatory Visit: Payer: BC Managed Care – PPO | Admitting: Occupational Therapy

## 2021-09-01 ENCOUNTER — Encounter: Payer: BC Managed Care – PPO | Admitting: Speech Pathology

## 2021-09-01 ENCOUNTER — Encounter: Payer: BC Managed Care – PPO | Admitting: Occupational Therapy

## 2021-09-02 ENCOUNTER — Ambulatory Visit: Payer: BC Managed Care – PPO

## 2021-09-02 DIAGNOSIS — M6281 Muscle weakness (generalized): Secondary | ICD-10-CM | POA: Diagnosis not present

## 2021-09-02 DIAGNOSIS — R278 Other lack of coordination: Secondary | ICD-10-CM
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
## 2021-09-06 ENCOUNTER — Ambulatory Visit: Payer: BC Managed Care – PPO

## 2021-09-06 ENCOUNTER — Encounter: Payer: BC Managed Care – PPO | Admitting: Occupational Therapy

## 2021-09-06 DIAGNOSIS — Z9181 History of falling: Secondary | ICD-10-CM

## 2021-09-06 DIAGNOSIS — M6281 Muscle weakness (generalized): Secondary | ICD-10-CM | POA: Diagnosis not present

## 2021-09-06 DIAGNOSIS — R2681 Unsteadiness on feet: Secondary | ICD-10-CM
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
## 2021-09-08 ENCOUNTER — Encounter: Payer: BC Managed Care – PPO | Admitting: Occupational Therapy

## 2021-09-09 ENCOUNTER — Ambulatory Visit: Payer: BC Managed Care – PPO | Admitting: Occupational Therapy

## 2021-09-09 ENCOUNTER — Encounter: Payer: Self-pay | Admitting: Occupational Therapy

## 2021-09-09 ENCOUNTER — Ambulatory Visit: Payer: BC Managed Care – PPO | Admitting: Speech Pathology

## 2021-09-09 DIAGNOSIS — R41841 Cognitive communication deficit: Secondary | ICD-10-CM

## 2021-09-09 DIAGNOSIS — M6281 Muscle weakness (generalized): Secondary | ICD-10-CM

## 2021-09-09 DIAGNOSIS — R4701 Aphasia: Secondary | ICD-10-CM
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
## 2021-09-15 ENCOUNTER — Ambulatory Visit: Payer: BC Managed Care – PPO | Admitting: Speech Pathology

## 2021-09-15 ENCOUNTER — Ambulatory Visit: Payer: BC Managed Care – PPO | Admitting: Occupational Therapy

## 2021-09-15 DIAGNOSIS — M6281 Muscle weakness (generalized): Secondary | ICD-10-CM | POA: Diagnosis not present

## 2021-09-15 DIAGNOSIS — R4701 Aphasia: Secondary | ICD-10-CM

## 2021-09-15 DIAGNOSIS — R278 Other lack of coordination: Secondary | ICD-10-CM

## 2021-09-15 DIAGNOSIS — R41841 Cognitive communication deficit: Secondary | ICD-10-CM
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
## 2021-09-20 ENCOUNTER — Ambulatory Visit: Payer: Medicaid Other | Attending: Physician Assistant

## 2021-09-20 DIAGNOSIS — Z9181 History of falling: Secondary | ICD-10-CM | POA: Insufficient documentation

## 2021-09-20 DIAGNOSIS — R278 Other lack of coordination: Secondary | ICD-10-CM | POA: Diagnosis present

## 2021-09-20 DIAGNOSIS — R41841 Cognitive communication deficit: Secondary | ICD-10-CM | POA: Diagnosis present

## 2021-09-20 DIAGNOSIS — R2681 Unsteadiness on feet: Secondary | ICD-10-CM | POA: Diagnosis present

## 2021-09-20 DIAGNOSIS — M6281 Muscle weakness (generalized): Secondary | ICD-10-CM | POA: Diagnosis present

## 2021-09-20 NOTE — Therapy (Signed)
[place] MAIN [facility] SERVICES [address] [place], [place], [address] Phone: [phone]   Fax:  [phone]  Physical Therapy Treatment  Patient Details  Name: Edgar [name] MRN: [id number] Date of Birth: 11-05-2000 Referring Provider (PT): Conley Rolls PA   Encounter Date: 09/20/2021   PT End of Session - 09/20/21 1336     Visit Number 2    Number of Visits 12    Date for PT Re-Evaluation 10/18/21    Authorization Type BCBS primary. Medicaid Secondary    Authorization Time Period 09/06/21-10/18/21    Authorization - Visit Number 1    Progress Note Due on Visit 10    PT Start Time [id number]    PT Stop Time 1429    PT Time Calculation (min) 45 min    Equipment Utilized During Treatment Gait belt    Activity Tolerance Patient tolerated treatment well    Behavior During Therapy WFL for tasks assessed/performed             Past Medical History:  Diagnosis Date   Traumatic brain injury ([place]) 2017    Past Surgical History:  Procedure Laterality Date   CSF SHUNT  2017   wisdom tooth removal      There were no vitals filed for this visit.   Subjective Assessment - 09/20/21 1402     Subjective Patient reports going to gym once since he was seen last. No falls or LOB since last session.    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    Currently in Pain? No/denies               Neuro Re-ed:   Speed ladder: cues for L foot placement due to inversion  -two feet in ; 2x length of // bars, good velocity with stability on toe's  -lateral two feet in, more challenging than forward with decreased foot clearance, good speed  -diagonal two feet in/out; challenging to perform sudden changes in momentum , 4x length  -in in out out laterally, very challenging for patient but improved in coordination with repetition   Airex pad:  Standing with CGA next to support surface:  Airex pad:  static stand 30 seconds x 2 trials, noticeable trembling of ankles/LE's with fatigue and challenge to maintain stability Airex pad: horizontal head turns 30 seconds scanning room 10x ; cueing for arc of motion  Airex pad: vertical head turns 30 seconds, cueing for arc of motion, noticeable sway with upward gaze increasing demand on ankle righting reaction musculature Airex pad: one foot on 6" step one foot on airex pad, hold position for 30 seconds, switch legs, 2x each LE;    Lateral step to 6 cones alternating toe tap 2x each LE  TherEx:    Lateral squat shuffle between 6 cones x 8 trials, good ability to maintain squat position    Sit to stand cross body punch and return to squat position 10x; 2 sets    Lunge with UE support 10x each LE ; heavy UE support cueing for L foot neutral position         Pt educated throughout session about proper posture and technique with exercises. Improved exercise technique, movement at target joints, use of target muscles after min to mod verbal, visual, tactile cues. Rationale for Evaluation and Treatment Rehabilitation    Patient presents with excellent motivation throughout physical therapy session. Stabilization and strengthening interventions tolerated well. Patient has  frequent L foot inversion requiring cueing for neutral alignment. Occasional knee hyperextension of LLE noted. He is challenged with vertical head nods. The patient would benefit from further skilled PT to maximize function, mobility, and independence.                 PT Education - 09/20/21 1336     Education provided Yes    Education Details exercise technique, body mechanics    Person(s) Educated Patient    Methods Explanation;Demonstration;Tactile cues;Verbal cues    Comprehension Verbalized understanding;Returned demonstration;Verbal cues required;Tactile cues required              PT Short Term Goals - 09/07/21 1259       PT SHORT TERM GOAL #1    Title Pt will report HEP compliance and demonstrate correct HEP technique for improved self-progression in balance in gait for ADL performance.    Baseline Pt not currently performing his HEP activity    Time 2    Period Weeks    Status New    Target Date 09/21/21      PT SHORT TERM GOAL #2   Title Pt to report return to baseline gym activity including aerobic and resistance workouts to facilitate return to baseline postural control and confidence in ADL/IADL performance.    Baseline eval: planning to return to gym this month, has not been consistently since Feb 2023.    Time 3    Period Weeks    Status New    Target Date 09/28/21      PT SHORT TERM GOAL #3   Title Improved confidence adn reduced falls anxiety with falling step strategey falls recovery testing (tested during MiniBestest performance)    Baseline eval: very anxious and risk averse impacting ability to fully participate due to fear of falling    Time 3    Period Weeks    Status New    Target Date 09/28/21               PT Long Term Goals - 09/10/21 0838       PT LONG TERM GOAL #1   Title Pt to demonstrate 6MWT >1655ft to demonstrate ability to cover community distances at appropriate speed for safe navigation of [facility] and other IADL performance.    Baseline eval: self selected gait speed at 0.63m/s    Time 4    Period Weeks    Status New    Target Date 10/05/21      PT LONG TERM GOAL #2   Title Pt to improve FOTO score >10 points to indicated improved feelings confidence in balance in performance of basic mobility to ADL/IADL performance.    Baseline eval: 55    Time 5    Period Weeks    Status New    Target Date 10/12/21      PT LONG TERM GOAL #3   Title Pt to improve miniBesTest score >26 to to indicate improve balance and reduced risk of falls.    Baseline Eval: 23/28    Time 6    Period Weeks    Status New    Target Date 10/19/21                   Plan - 09/20/21 1524      Clinical Impression Statement Patient presents with excellent motivation throughout physical therapy session. Stabilization and strengthening interventions tolerated well. Patient has frequent L foot inversion requiring cueing for neutral alignment. Occasional  knee hyperextension of LLE noted. He is challenged with vertical head nods. The patient would benefit from further skilled PT to maximize function, mobility, and independence.    Personal Factors and Comorbidities Age;Behavior Pattern;Past/Current Experience    Examination-Activity Limitations Locomotion Level;Reach Overhead    Examination-Participation Restrictions Occupation;Community Activity    Stability/Clinical Decision Making Stable/Uncomplicated    Rehab Potential Excellent    PT Frequency 2x / week    PT Duration 6 weeks    PT Treatment/Interventions Cryotherapy;Electrical Stimulation;Moist Heat;Gait training;Neuromuscular re-education;Balance training;Therapeutic exercise;Therapeutic activities;Functional mobility training;Stair training;Patient/family education;Orthotic Fit/Training;Energy conservation;Dry needling;Passive range of motion;DME Instruction    PT Next Visit Plan Finish MiniBest, establish HEP    PT Home Exercise Plan defer to visit 2    Consulted and Agree with Plan of Care Patient             Patient will benefit from skilled therapeutic intervention in order to improve the following deficits and impairments:  Abnormal gait, Decreased cognition, Decreased mobility, Decreased activity tolerance, Decreased endurance, Decreased strength, Difficulty walking, Decreased safety awareness, Decreased balance, Decreased coordination, Postural dysfunction  Visit Diagnosis: Muscle weakness (generalized)  Unsteadiness on feet  History of falling     Problem List There are no problems to display for this patient.   Janna Arch, PT, DPT  09/20/2021, 3:25 PM  [place] MAIN  [facility] SERVICES [address] [place], [place], [address] Phone: [phone]   Fax:  [phone]  Name: Edgar [name] MRN: [id number] Date of Birth: 09/17/2000

## 2021-09-22 ENCOUNTER — Ambulatory Visit: Payer: Medicaid Other | Admitting: Occupational Therapy

## 2021-09-22 ENCOUNTER — Encounter: Payer: Self-pay | Admitting: Occupational Therapy

## 2021-09-22 ENCOUNTER — Ambulatory Visit: Payer: Medicaid Other | Admitting: Speech Pathology

## 2021-09-22 DIAGNOSIS — M6281 Muscle weakness (generalized): Secondary | ICD-10-CM | POA: Diagnosis not present

## 2021-09-22 DIAGNOSIS — R41841 Cognitive communication deficit: Secondary | ICD-10-CM
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
## 2021-09-28 ENCOUNTER — Ambulatory Visit: Payer: Medicaid Other

## 2021-09-28 ENCOUNTER — Ambulatory Visit: Payer: Medicaid Other | Admitting: Occupational Therapy

## 2021-09-28 DIAGNOSIS — R2681 Unsteadiness on feet: Secondary | ICD-10-CM

## 2021-09-28 DIAGNOSIS — M6281 Muscle weakness (generalized): Secondary | ICD-10-CM

## 2021-09-28 DIAGNOSIS — R278 Other lack of coordination: Secondary | ICD-10-CM

## 2021-09-28 DIAGNOSIS — Z9181 History of falling: Secondary | ICD-10-CM
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
## 2021-09-30 ENCOUNTER — Encounter: Payer: BC Managed Care – PPO | Admitting: Speech Pathology

## 2021-09-30 ENCOUNTER — Ambulatory Visit: Payer: BC Managed Care – PPO

## 2021-09-30 ENCOUNTER — Encounter: Payer: BC Managed Care – PPO | Admitting: Occupational Therapy

## 2021-10-04 ENCOUNTER — Ambulatory Visit: Payer: BC Managed Care – PPO | Admitting: Physical Therapy

## 2021-10-07 ENCOUNTER — Ambulatory Visit: Payer: Medicaid Other

## 2021-10-07 ENCOUNTER — Ambulatory Visit: Payer: Medicaid Other | Admitting: Speech Pathology

## 2021-10-11 ENCOUNTER — Ambulatory Visit: Payer: Medicaid Other

## 2021-10-11 ENCOUNTER — Ambulatory Visit: Payer: Medicaid Other | Admitting: Occupational Therapy

## 2021-10-11 DIAGNOSIS — M6281 Muscle weakness (generalized): Secondary | ICD-10-CM

## 2021-10-11 DIAGNOSIS — R2681 Unsteadiness on feet: Secondary | ICD-10-CM

## 2021-10-11 DIAGNOSIS — R278 Other lack of coordination: Secondary | ICD-10-CM

## 2021-10-12 ENCOUNTER — Encounter: Payer: Self-pay | Admitting: Occupational Therapy

## 2021-10-14 ENCOUNTER — Encounter: Payer: BC Managed Care – PPO | Admitting: Speech Pathology

## 2021-10-14 ENCOUNTER — Encounter: Payer: BC Managed Care – PPO | Admitting: Occupational Therapy

## 2021-10-20 ENCOUNTER — Ambulatory Visit: Payer: Medicaid Other

## 2021-10-20 ENCOUNTER — Ambulatory Visit: Payer: BC Managed Care – PPO | Admitting: Physical Therapy

## 2021-10-21 ENCOUNTER — Ambulatory Visit: Payer: Medicaid Other | Attending: Physician Assistant | Admitting: Speech Pathology

## 2021-10-21 DIAGNOSIS — R4701 Aphasia: Secondary | ICD-10-CM | POA: Diagnosis present

## 2021-10-21 DIAGNOSIS — R278 Other lack of coordination: Secondary | ICD-10-CM | POA: Diagnosis present

## 2021-10-21 DIAGNOSIS — R41841 Cognitive communication deficit: Secondary | ICD-10-CM | POA: Insufficient documentation

## 2021-10-21 DIAGNOSIS — M6281 Muscle weakness (generalized): Secondary | ICD-10-CM | POA: Insufficient documentation
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
## 2021-10-26 ENCOUNTER — Ambulatory Visit: Payer: Medicaid Other | Admitting: Occupational Therapy

## 2021-10-26 ENCOUNTER — Ambulatory Visit: Payer: Medicaid Other | Admitting: Speech Pathology

## 2021-10-26 ENCOUNTER — Ambulatory Visit: Payer: BC Managed Care – PPO

## 2021-10-26 ENCOUNTER — Encounter: Payer: Self-pay | Admitting: Occupational Therapy

## 2021-10-26 DIAGNOSIS — R4701 Aphasia: Secondary | ICD-10-CM

## 2021-10-26 DIAGNOSIS — M6281 Muscle weakness (generalized): Secondary | ICD-10-CM

## 2021-10-26 DIAGNOSIS — R41841 Cognitive communication deficit: Secondary | ICD-10-CM | POA: Diagnosis not present
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
## 2021-11-01 ENCOUNTER — Ambulatory Visit: Payer: Medicaid Other | Admitting: Occupational Therapy

## 2021-11-01 ENCOUNTER — Ambulatory Visit: Payer: BC Managed Care – PPO | Admitting: Physical Therapy

## 2021-11-01 ENCOUNTER — Ambulatory Visit: Payer: Medicaid Other | Admitting: Speech Pathology

## 2021-11-01 DIAGNOSIS — M6281 Muscle weakness (generalized): Secondary | ICD-10-CM

## 2021-11-01 DIAGNOSIS — R41841 Cognitive communication deficit: Secondary | ICD-10-CM | POA: Diagnosis not present

## 2021-11-01 DIAGNOSIS — R4701 Aphasia: Secondary | ICD-10-CM

## 2021-11-01 DIAGNOSIS — R278 Other lack of coordination: Secondary | ICD-10-CM

## 2021-11-01 NOTE — Patient Instructions (Signed)
Please complete the assigned homework.
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
## 2021-11-08 ENCOUNTER — Ambulatory Visit: Payer: Medicaid Other | Admitting: Occupational Therapy

## 2021-11-08 ENCOUNTER — Ambulatory Visit: Payer: BC Managed Care – PPO | Admitting: Physical Therapy

## 2021-11-08 ENCOUNTER — Ambulatory Visit: Payer: Medicaid Other | Admitting: Speech Pathology

## 2021-11-08 DIAGNOSIS — R4701 Aphasia: Secondary | ICD-10-CM

## 2021-11-08 DIAGNOSIS — R41841 Cognitive communication deficit: Secondary | ICD-10-CM

## 2021-11-08 DIAGNOSIS — M6281 Muscle weakness (generalized): Secondary | ICD-10-CM

## 2021-11-08 DIAGNOSIS — R278 Other lack of coordination: Secondary | ICD-10-CM
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
## 2021-11-15 ENCOUNTER — Ambulatory Visit: Payer: BC Managed Care – PPO | Admitting: Physical Therapy

## 2021-11-15 ENCOUNTER — Ambulatory Visit: Payer: Medicaid Other | Admitting: Speech Pathology

## 2021-11-15 DIAGNOSIS — R41841 Cognitive communication deficit: Secondary | ICD-10-CM

## 2021-11-15 DIAGNOSIS — R4701 Aphasia: Secondary | ICD-10-CM
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
## 2021-11-15 NOTE — Patient Instructions (Signed)
To help you not forget things when you leave the house: See if you can work together with your mom to agree on a location near the door where you could place items you need to take with you when you leave the house. So as you pack up to get ready to go, you can place those items in a bag or basket that you will see as you are walking out the door.   Memory in general: -Keep using your reminders app. Remember you might be able to set a reminder to go off when you leave or arrive at a certain location, not just time.   For organization: Use folders in your voice memos to help you organize the information (one for each class, VR) Make folders in your reminders for each class where you can save reminders about homework, tests, projects and other deadlines.  For wordfinding/communicatoin: -Remember to try to preplan when you have an important or professional conversation coming up. Try to anticipate what questions the other person might ask you, and write down some key words to help you remember what questions you'd like to ask, or details you would like to share with the person.  -If you forget a word, try not to get "stuck" trying to think of that word: try finding and using a replacement word, or describe the word to the other person so that they understand the meaning even if you can't find the word  For text-to-speech: On your computer: Highlight and right click on read selection aloud. When the toolbar comes up, you can click on voice options to change the speed to make it faster or slower. If you are having trouble within an app, try copy and pasting the content into a word doc and see if that helps. On your phone: highlight text and then scroll through the menu to Speak, or swipe two fingers down from the top to play the whole screen.  Self-Advocacy: Don't be afraid to ask for what you need to help you do your best in school and communicating with other people! This might mean:   -asking for  extra time or patience -letting other people know some differences about you or some of the strategies you use to help you function better

## 2021-11-22 ENCOUNTER — Ambulatory Visit: Payer: BC Managed Care – PPO | Admitting: Physical Therapy

## 2021-11-22 ENCOUNTER — Encounter: Payer: BC Managed Care – PPO | Admitting: Speech Pathology

## 2021-11-29 ENCOUNTER — Encounter: Payer: BC Managed Care – PPO | Admitting: Speech Pathology

## 2021-11-29 ENCOUNTER — Ambulatory Visit: Payer: BC Managed Care – PPO | Admitting: Physical Therapy

## 2021-12-07 ENCOUNTER — Encounter: Payer: BC Managed Care – PPO | Admitting: Speech Pathology

## 2021-12-07 ENCOUNTER — Ambulatory Visit: Payer: BC Managed Care – PPO | Admitting: Physical Therapy

## 2021-12-13 ENCOUNTER — Encounter: Payer: BC Managed Care – PPO | Admitting: Speech Pathology

## 2021-12-13 ENCOUNTER — Ambulatory Visit: Payer: BC Managed Care – PPO | Admitting: Physical Therapy

## 2022-04-03 ENCOUNTER — Emergency Department
Admission: EM | Admit: 2022-04-03 | Discharge: 2022-04-03 | Disposition: A | Discharge status: Home / Self Care | Payer: BC Managed Care – PPO | Attending: Emergency Medicine | Admitting: Emergency Medicine

## 2022-04-03 ENCOUNTER — Other Ambulatory Visit: Payer: Self-pay

## 2022-04-03 DIAGNOSIS — K921 Melena: Secondary | ICD-10-CM | POA: Insufficient documentation

## 2022-04-03 LAB — COMPREHENSIVE METABOLIC PANEL
ALT: 32 U/L (ref 0–44)
AST: 22 U/L (ref 15–41)
Albumin: 4.1 g/dL (ref 3.5–5.0)
Alkaline Phosphatase: 77 U/L (ref 38–126)
Anion gap: 8 (ref 5–15)
BUN: 12 mg/dL (ref 6–20)
CO2: 25 mmol/L (ref 22–32)
Calcium: 9.3 mg/dL (ref 8.9–10.3)
Chloride: 104 mmol/L (ref 98–111)
Creatinine, Ser: 0.89 mg/dL (ref 0.61–1.24)
GFR, Estimated: >60 mL/min (ref >60)
Glucose, Bld: 101 mg/dL — ABNORMAL HIGH (ref 70–99)
Potassium: 3.8 mmol/L (ref 3.5–5.1)
Sodium: 137 mmol/L (ref 135–145)
Total Bilirubin: 0.8 mg/dL (ref 0.3–1.2)
Total Protein: 8 g/dL (ref 6.5–8.1)

## 2022-04-03 LAB — TYPE AND SCREEN
ABO/RH(D): O NEG
Antibody Screen: POSITIVE

## 2022-04-03 LAB — CBC
HCT: 45 % (ref 39.0–52.0)
Hemoglobin: 15.3 g/dL (ref 13.0–17.0)
MCH: 29.9 pg (ref 26.0–34.0)
MCHC: 34 g/dL (ref 30.0–36.0)
MCV: 87.9 fL (ref 80.0–100.0)
Platelets: 285 10*3/uL (ref 150–400)
RBC: 5.12 MIL/uL (ref 4.22–5.81)
RDW: 12.2 % (ref 11.5–15.5)
WBC: 7.5 10*3/uL (ref 4.0–10.5)
nRBC: 0 % (ref 0.0–0.2)

## 2022-04-03 MED ORDER — DIBUCAINE (PERIANAL) 1 % EX OINT: 1.0000 | TOPICAL_OINTMENT | CUTANEOUS | 0 refills | Status: DC | PRN

## 2022-04-03 MED ORDER — HYDROCORTISONE ACETATE 25 MG RE SUPP: 25.0000 mg | Freq: Two times a day (BID) | RECTAL | 1 refills | Status: AC

## 2022-04-03 NOTE — ED Provider Notes (Signed)
[facility] Provider Note  Patient Contact: 6:40 PM (approximate)   History   hematochochezia and Blood In Stools   HPI  Edgar [name] is a 21 y.o. male presents to the emergency department with concern for bright red blood and clots after patient has large bowel movements.  Patient denies perianal pain.  No abdominal pain or hemoptysis.  No similar symptoms in the past.      Physical Exam   Triage Vital Signs: ED Triage Vitals  Enc Vitals Group     BP 04/03/22 1553 124/84     Pulse Rate 04/03/22 1553 63     Resp 04/03/22 1553 16     Temp 04/03/22 1553 98 F (36.7 C)     Temp Source 04/03/22 1553 Oral     SpO2 04/03/22 1553 97 %     Weight 04/03/22 1554 240 lb (108.9 kg)     Height 04/03/22 1554 6\' 3"  (1.905 m)     Head Circumference --      Peak Flow --      Pain Score 04/03/22 1554 0     Pain Loc --      Pain Edu? --      Excl. in GC? --     Most recent vital signs: Vitals:   04/03/22 1553  BP: 124/84  Pulse: 63  Resp: 16  Temp: 98 F (36.7 C)  SpO2: 97%     General: Alert and in no acute distress. Eyes:  PERRL. EOMI. Head: No acute traumatic findings ENT:      Nose: No congestion/rhinnorhea.      Mouth/Throat: Mucous membranes are moist.  Neck: No stridor. No cervical spine tenderness to palpation. Cardiovascular:  Good peripheral perfusion Respiratory: Normal respiratory effort without tachypnea or retractions. Lungs CTAB. Good air entry to the bases with no decreased or absent breath sounds. Gastrointestinal: Bowel sounds 4 quadrants. Soft and nontender to palpation. No guarding or rigidity. No palpable masses. No distention. No CVA tenderness.  No external hemorrhoids visualized.  Patient does have dried red blood rectum. Musculoskeletal: Full range of motion to all extremities.  Neurologic:  No gross focal neurologic deficits are appreciated.  Skin:   No rash noted Other:   ED Results / Procedures / Treatments    Labs (all labs ordered are listed, but only abnormal results are displayed) Labs Reviewed  COMPREHENSIVE METABOLIC PANEL - Abnormal; Notable for the following components:      Result Value   Glucose, Bld 101 (*)    All other components within normal limits  CBC  POC OCCULT BLOOD, ED  TYPE AND SCREEN       PROCEDURES:  Critical Care performed: No  Procedures   MEDICATIONS ORDERED IN ED: Medications - No data to display   IMPRESSION / MDM / ASSESSMENT AND PLAN / ED COURSE  I reviewed the triage vital signs and the nursing notes.                              Assessment and plan Internal hemorrhoids 21 year old male presents to the emergency department with concern for bright red bleeding and clots after straining.  Vital signs are reassuring at triage.  On exam, patient was alert, active and nontoxic-appearing   CBC and CMP were reassuring.  Suspect internal hemorrhoids based on physical exam.  Will prescribe patient Anusol and DBT and have patient follow-up with primary care as  needed.   FINAL CLINICAL IMPRESSION(S) / ED DIAGNOSES   Final diagnoses:  Hematochezia     Rx / DC Orders   ED Discharge Orders          Ordered    hydrocortisone (ANUSOL-HC) 25 MG suppository  Every 12 hours        04/03/22 1838    dibucaine (NUPERCAINAL) 1 % OINT  As needed        04/03/22 1838             Note:  This document was prepared using Dragon voice recognition software and may include unintentional dictation errors.   Pia Mau [place], PA-C 04/03/22 1842    Georga Hacking, MD 04/03/22 [phone]

## 2022-04-03 NOTE — Discharge Instructions (Addendum)
Remove wrapper and insert one suppository into the anus at night, in the morning and after bowel movement up to a maximum of three per day for a maximum period of one week. Not to be taken orally

## 2022-04-03 NOTE — ED Triage Notes (Signed)
Pt to ED with blood in stool, bright red and has picture. Started about 4 days ago. Pt advised it has not been constant. Very intermittent. Assume hemorrhoids initially. Pt has found bright red blood clots in his stool twice. Pt is CAOx4 and in no acute distress.

## 2023-07-13 ENCOUNTER — Telehealth: Payer: Self-pay | Admitting: Family Medicine

## 2023-07-13 NOTE — Telephone Encounter (Signed)
 Copied from CRM [id number]. Topic: General - Other >> Jul 13, 2023 10:07 AM Gildardo Pounds wrote: Reason for CRM: Gala Romney is Child psychotherapist with [employer] needs paperwork filled out by patient's PCP. Patient's appointment to establish care is 01-10-2024. Callback number is [phone]

## 2023-07-13 NOTE — Telephone Encounter (Signed)
 This patient is scheduled to see me as a new patient on 01/10/24.  The fax we received is from [employer] for [employer] for this patient.  I attempted to call Caren Griffins back at [phone]. And I left a voicemail.  If Doug calls back or if you can try again to reach him later this week.  I am not quite sure our options in this circumstance. Since the patient has not established with our office yet, I am not sure that I am going to be able to sign off on everything until September 2025.  I would like to ask if this order could be re-routed to his previous PCP office to sign off. I am unsure if his provider is still there or if an alternative provider at [facility] can sign in the interim until patient is actually established here.  Thank you!  Saralyn Pilar, DO [facility] 07/13/2023, 4:25 PM

## 2023-07-14 NOTE — Telephone Encounter (Signed)
 Thank you for calling and making arrangements on this. We will stay tuned. If his previous PCP office is not able to handle it we will see what [employer] says next and what we need. Only other solution is to move his appointment up earlier if we have to.

## 2024-01-10 ENCOUNTER — Encounter: Payer: Self-pay | Admitting: Family Medicine

## 2024-01-10 ENCOUNTER — Ambulatory Visit (INDEPENDENT_AMBULATORY_CARE_PROVIDER_SITE_OTHER): Admitting: Family Medicine

## 2024-01-10 VITALS — BP 120/70 | HR 69 | Ht 75.0 in | Wt 246.4 lb

## 2024-01-10 DIAGNOSIS — Z23 Encounter for immunization: Secondary | ICD-10-CM | POA: Diagnosis not present

## 2024-01-10 DIAGNOSIS — G8191 Hemiplegia, unspecified affecting right dominant side: Secondary | ICD-10-CM

## 2024-01-10 DIAGNOSIS — Z87820 Personal history of traumatic brain injury: Secondary | ICD-10-CM

## 2024-01-10 DIAGNOSIS — Z982 Presence of cerebrospinal fluid drainage device: Secondary | ICD-10-CM

## 2024-01-10 DIAGNOSIS — Z7689 Persons encountering health services in other specified circumstances: Secondary | ICD-10-CM | POA: Diagnosis not present

## 2024-01-10 DIAGNOSIS — G910 Communicating hydrocephalus: Secondary | ICD-10-CM

## 2024-01-10 DIAGNOSIS — M6281 Muscle weakness (generalized): Secondary | ICD-10-CM | POA: Insufficient documentation

## 2024-01-10 NOTE — Patient Instructions (Addendum)
 Thank you for coming to the office today.    Please schedule a Follow-up Appointment to: Return for 4-6 weeks Annual Physical (maybe labs after).  If you have any other questions or concerns, please feel free to call the office or send a message through MyChart. You may also schedule an earlier appointment if necessary.  Additionally, you may be receiving a survey about your experience at our office within a few days to 1 week by e-mail or mail. We value your feedback.  Marsa Officer, DO [facility], [place]
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
## 2024-02-08 ENCOUNTER — Ambulatory Visit: Payer: Self-pay

## 2024-02-08 ENCOUNTER — Encounter: Payer: Self-pay | Admitting: Internal Medicine

## 2024-02-08 ENCOUNTER — Ambulatory Visit: Admitting: Internal Medicine

## 2024-02-08 VITALS — BP 118/74 | Ht 75.0 in | Wt 246.2 lb

## 2024-02-08 DIAGNOSIS — S76212A Strain of adductor muscle, fascia and tendon of left thigh, initial encounter: Secondary | ICD-10-CM

## 2024-02-08 NOTE — Patient Instructions (Signed)
 Adductor Muscle Strain With Rehab Ask your health care provider which exercises are safe for you. Do exercises exactly as told by your health care provider and adjust them as directed. It is normal to feel mild stretching, pulling, tightness, or mild discomfort as you do these exercises. Stop right away if you feel sudden pain or your pain gets worse. Do not begin these exercises until told by your health care provider. Strengthening exercises These exercises build strength and endurance in your thighs. Endurance is the ability to use your muscles for a long time, even after your muscles get tired. Hip adductor isometrics This exercise is sometimes called inner thigh squeeze. Sit on a firm chair that positions your knees at about the same height as your hips. Place a large ball, firm pillow, or rolled-up bath towel between your thighs. Squeeze your thighs together, gradually building tension. Hold for __________ seconds. Release the tension gradually. Allow your inner thigh muscles to relax completely before you start the next repetition. Repeat __________ times. Complete this exercise __________ times a day. Hip adduction This exercise is sometimes called side-lying straight leg raises. Lie on your side so your head, shoulder, knee, and hip are in a straight line with each other. To help maintain your balance, you may put the foot of your top leg in front of the leg that is on the floor. Your left / right leg should be on the bottom. Roll your hips slightly forward so your hips are stacked directly over each other and your left / right knee is facing forward. Tense the muscles of your inner thigh and lift your bottom leg 4-6 inches (10-15 cm). Hold this position for __________ seconds. Slowly lower your leg to the starting position. Allow your muscles to relax completely before you start the next repetition. Repeat __________ times. Complete this exercise __________ times a day. Hip  extension This exercise is sometimes called prone (on your belly) straight leg raises. Lie on your belly on a bed or a firm surface with a pillow under your hips. Tense your buttock muscles and lift your left / right thigh off the bed. Your left / right knee can be bent or straight, but do not let your back arch. Hold this position for __________ seconds. Slowly return to the starting position. Allow your muscles to relax completely before you start the next repetition. Repeat __________ times. Complete this exercise __________ times a day. Balance exercises These exercises improve or maintain your balance. Balance is important in preventing falls. Single-leg balance  Stand near a railing or by a door frame that you can hold onto as needed. Stand on your left / right foot. Keep your big toe down on the floor and try to keep your arch lifted. If this is too easy, you can stand with your eyes closed or stand on a pillow. Hold this position for __________ seconds. Repeat __________ times. Complete this exercise __________ times a day. Side lunges  Stand with your feet together. Keeping one foot in place, step to the side with your other foot about __________ inches (__________ cm). Do not step so far that you feel discomfort in your middle thigh. Push off from your stepping foot to return to the starting position. Repeat __________ times. Complete this exercise __________ times a day. This information is not intended to replace advice given to you by your health care provider. Make sure you discuss any questions you have with your health care provider. Document Revised: 08/08/2022  Document Reviewed: 09/22/2020 Elsevier Patient Education  2024 [employer].

## 2024-02-08 NOTE — Progress Notes (Signed)
 Subjective:    Patient ID: Edgar [name], male    DOB: 2000/11/04, 23 y.o.   MRN: [id number]  HPI  Discussed the use of AI scribe software for clinical note transcription with the patient, who gave verbal consent to proceed.   Edgar [name] is a 23 year old male who presents with hip pain.  He has been experiencing left groin pain that began yesterday after tripping down some stairs and landing into the back of a chair.  The pain has been present since yesterday, with significant discomfort last night and this morning although the pain has improved at this time. He describes the pain as sharp, particularly when stepping, and notes that it is worse with walking. No numbness, tingling, or bruising has been noticed.  He reports history of pain in this area years ago which he related to overexertion.  He has not taken any medications such as ibuprofen or tylenol for the pain.    Review of Systems   Past Medical History:  Diagnosis Date   Traumatic brain injury (HCC) 2017    Current Outpatient Medications  Medication Sig Dispense Refill   aspirin EC 81 MG tablet Take 81 mg by mouth daily.     No current facility-administered medications for this visit.    No Known Allergies  Family History  Problem Relation Age of Onset   Heart disease Mother     Social History   Socioeconomic History   Marital status: Single    Spouse name: Not on file   Number of children: Not on file   Years of education: Not on file   Highest education level: Associate degree: academic program  Occupational History   Not on file  Tobacco Use   Smoking status: Never   Smokeless tobacco: Never  Vaping Use   Vaping status: Never Used  Substance and Sexual Activity   Alcohol use: No   Drug use: Not Currently   Sexual activity: Yes  Other Topics Concern   Not on file  Social History Narrative   Not on file   Social Drivers of Health   Financial Resource Strain: Low Risk  (02/07/2024)    Overall Financial Resource Strain (CARDIA)    Difficulty of Paying Living Expenses: Not very hard  Food Insecurity: No Food Insecurity (02/07/2024)   Hunger Vital Sign    Worried About Running Out of Food in the Last Year: Never true    Ran Out of Food in the Last Year: Never true  Transportation Needs: No Transportation Needs (02/07/2024)   PRAPARE - Administrator, Civil Service (Medical): No    Lack of Transportation (Non-Medical): No  Physical Activity: Sufficiently Active (02/07/2024)   Exercise Vital Sign    Days of Exercise per Week: 5 days    Minutes of Exercise per Session: 60 min  Stress: No Stress Concern Present (02/07/2024)   [employer] of Occupational Health - Occupational Stress Questionnaire    Feeling of Stress: Not at all  Social Connections: Moderately Isolated (02/07/2024)   Social Connection and Isolation Panel    Frequency of Communication with Friends and Family: Three times a week    Frequency of Social Gatherings with Friends and Family: Twice a week    Attends Religious Services: More than 4 times per year    Active Member of [employer] or Organizations: No    Attends Engineer, structural: Not on file    Marital Status: Never married  Intimate Partner Violence: Not on file     Constitutional: Denies fever, malaise, fatigue, headache or abrupt weight changes.  Respiratory: Denies difficulty breathing, shortness of breath, cough or sputum production.   Cardiovascular: Denies chest pain, chest tightness, palpitations or swelling in the hands or feet.  Gastrointestinal: Denies abdominal pain, bloating, constipation, diarrhea or blood in the stool.  GU: Denies urgency, frequency, pain with urination, burning sensation, blood in urine, odor or discharge. Musculoskeletal: Patient reports left groin pain.  Denies decrease in range of motion, difficulty with gait, or joint pain swelling.  Skin: Denies redness, rashes, lesions or  ulcercations.  Neurological: Denies numbness, tingling, weakness or problems with balance and coordination.    No other specific complaints in a complete review of systems (except as listed in HPI above).      Objective:   Physical Exam BP 118/74 (BP Location: Right Arm, Patient Position: Sitting, Cuff Size: Large)   Ht 6' 3 (1.905 m)   Wt 246 lb 3.2 oz (111.7 kg)   BMI 30.77 kg/m   Wt Readings from Last 3 Encounters:  01/10/24 246 lb 6 oz (111.8 kg)  04/03/22 240 lb (108.9 kg)  12/06/20 240 lb 1.3 oz (108.9 kg)    General: Appears his stated age, obese, in NAD. Skin: Warm, dry and intact. No bruising reported. Cardiovascular: Normal rate and rhythm.  Pulmonary/Chest: Normal effort and positive vesicular breath sounds. No respiratory distress. No wheezes, rales or ronchi noted.  Musculoskeletal: No bony tenderness noted to the lumbar spine or left SI joint.  Normal passive internal and external rotation of the left hip, abduction and adduction of the left hip.  No pain with palpation over the left trochanter or iliac crest.  Gait slow and steady without device. Neurological: Alert and oriented.   BMET    Component Value Date/Time   NA 137 04/03/2022 1600   K 3.8 04/03/2022 1600   CL 104 04/03/2022 1600   CO2 25 04/03/2022 1600   GLUCOSE 101 (H) 04/03/2022 1600   BUN 12 04/03/2022 1600   CREATININE 0.89 04/03/2022 1600   CALCIUM 9.3 04/03/2022 1600   GFRNONAA >60 04/03/2022 1600   GFRAA >60 11/18/2019 1915    Lipid Panel  No results found for: CHOL, TRIG, HDL, CHOLHDL, VLDL, LDLCALC  CBC    Component Value Date/Time   WBC 7.5 04/03/2022 1600   RBC 5.12 04/03/2022 1600   HGB 15.3 04/03/2022 1600   HCT 45.0 04/03/2022 1600   PLT 285 04/03/2022 1600   MCV 87.9 04/03/2022 1600   MCH 29.9 04/03/2022 1600   MCHC 34.0 04/03/2022 1600   RDW 12.2 04/03/2022 1600    Hgb A1C No results found for: HGBA1C          Assessment & Plan:  Assessment  and Plan    Acute left groin muscle strain Acute strain likely from overexertion and hyperextension. Pain with leg movement. No hip bone involvement. Slight symptom improvement. - Ibuprofen 200 mg with food, morning and evening for 2-3 days. - Heat application or hot bath. - Stretching exercises: knee to chest, hold to side. - Rest and ice application. - Consider physical therapy if symptoms persist.        Follow-up with your PCP as previously scheduled Angeline Laura, NP

## 2024-02-08 NOTE — Telephone Encounter (Signed)
 Patient reports left leg pain since yesterday. Patient unsure what caused the pain but states he tripped and fell over something. Patient is scheduled for an appointment today at 11:40 AM with another provider in the office.   FYI Only or Action Required?: FYI only for provider.  Patient was last seen in primary care on 01/10/2024 by Edman Marsa PARAS, DO.  Called Nurse Triage reporting Leg Pain.  Symptoms began yesterday.  Interventions attempted: Rest, hydration, or home remedies.  Symptoms are: unchanged.  Triage Disposition: See HCP Within 4 Hours (Or PCP Triage)  Patient/caregiver understands and will follow disposition?: Yes  FYI Only or Action Required?: FYI only for provider.  Patient was last seen in primary care on 01/10/2024 by Edman Marsa PARAS, DO.  Called Nurse Triage reporting Leg Pain.  Symptoms began yesterday.  Interventions attempted: Rest, hydration, or home remedies.  Symptoms are: unchanged.  Triage Disposition: See HCP Within 4 Hours (Or PCP Triage)  Patient/caregiver understands and will follow disposition?: Yes  Copied from CRM [id number]. Topic: Clinical - Red Word Triage >> Feb 08, 2024  9:56 AM Gustabo D wrote: Pt did something to his leg says it hurt to raise it and walk - Left leg Reason for Disposition  [1] SEVERE pain (e.g., excruciating, unable to do any normal activities) AND [2] not improved after 2 hours of pain medicine  Answer Assessment - Initial Assessment Questions 1. ONSET: When did the pain start?      Started yesterday-patient states he did trip and hit something.  2. LOCATION: Where is the pain located?      Left leg 3. PAIN: How bad is the pain?    (Scale 1-10; or mild, moderate, severe)     8 out of 10 4. WORK OR EXERCISE: Has there been any recent work or exercise that involved this part of the body?      no 5. CAUSE: What do you think is causing the leg pain?     Patient states he is unsure but did  states he did trip and hit something.  6. OTHER SYMPTOMS: Do you have any other symptoms? (e.g., chest pain, back pain, breathing difficulty, swelling, rash, fever, numbness, weakness)     no  Protocols used: Leg Pain-A-AH

## 2024-02-08 NOTE — Telephone Encounter (Signed)
Will discuss at upcoming appointment today 

## 2024-02-09 ENCOUNTER — Ambulatory Visit (INDEPENDENT_AMBULATORY_CARE_PROVIDER_SITE_OTHER): Admitting: Family Medicine

## 2024-02-09 ENCOUNTER — Encounter: Payer: Self-pay | Admitting: Family Medicine

## 2024-02-09 VITALS — BP 124/80 | HR 68 | Ht 75.0 in | Wt 246.0 lb

## 2024-02-09 DIAGNOSIS — Z0000 Encounter for general adult medical examination without abnormal findings: Secondary | ICD-10-CM | POA: Diagnosis not present

## 2024-02-09 DIAGNOSIS — Z87820 Personal history of traumatic brain injury: Secondary | ICD-10-CM | POA: Diagnosis not present

## 2024-02-09 DIAGNOSIS — G8191 Hemiplegia, unspecified affecting right dominant side: Secondary | ICD-10-CM | POA: Diagnosis not present

## 2024-02-09 DIAGNOSIS — Z982 Presence of cerebrospinal fluid drainage device: Secondary | ICD-10-CM

## 2024-02-09 DIAGNOSIS — G910 Communicating hydrocephalus: Secondary | ICD-10-CM

## 2024-02-09 NOTE — Patient Instructions (Addendum)
 Thank you for coming to the office today.  We will follow up with you every 6 months or twice a year  Next one no labs required.  We will plan for next October to do fasting blood work  Please schedule a Follow-up Appointment to: Return in about 6 months (around 08/09/2024) for 6 month follow-up.  If you have any other questions or concerns, please feel free to call the office or send a message through MyChart. You may also schedule an earlier appointment if necessary.  Additionally, you may be receiving a survey about your experience at our office within a few days to 1 week by e-mail or mail. We value your feedback.  Marsa Officer, DO [facility], [place]
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
## 2024-08-09 ENCOUNTER — Ambulatory Visit: Admitting: Family Medicine
